# Patient Record
Sex: Male | Born: 1945 | Race: White | Hispanic: No | Marital: Married | Smoking: Former smoker
Health system: Southern US, Community
[De-identification: ages and names within clinical notes are randomized; demographics above are authoritative.]

## PROBLEM LIST (undated history)

## (undated) ENCOUNTER — Ambulatory Visit (HOSPITAL_COMMUNITY): Admission: EM

## (undated) DIAGNOSIS — M1990 Unspecified osteoarthritis, unspecified site: Secondary | ICD-10-CM

## (undated) DIAGNOSIS — B019 Varicella without complication: Secondary | ICD-10-CM

## (undated) DIAGNOSIS — I771 Stricture of artery: Secondary | ICD-10-CM

## (undated) DIAGNOSIS — C61 Malignant neoplasm of prostate: Secondary | ICD-10-CM

## (undated) DIAGNOSIS — T7840XA Allergy, unspecified, initial encounter: Secondary | ICD-10-CM

## (undated) DIAGNOSIS — E785 Hyperlipidemia, unspecified: Secondary | ICD-10-CM

## (undated) DIAGNOSIS — I2510 Atherosclerotic heart disease of native coronary artery without angina pectoris: Secondary | ICD-10-CM

## (undated) DIAGNOSIS — I10 Essential (primary) hypertension: Secondary | ICD-10-CM

## (undated) DIAGNOSIS — N189 Chronic kidney disease, unspecified: Secondary | ICD-10-CM

## (undated) HISTORY — DX: Atherosclerotic heart disease of native coronary artery without angina pectoris: I25.10

## (undated) HISTORY — DX: Varicella without complication: B01.9

## (undated) HISTORY — PX: COLONOSCOPY: SHX174

## (undated) HISTORY — DX: Hyperlipidemia, unspecified: E78.5

## (undated) HISTORY — DX: Stricture of artery: I77.1

## (undated) HISTORY — DX: Essential (primary) hypertension: I10

## (undated) HISTORY — DX: Chronic kidney disease, unspecified: N18.9

## (undated) HISTORY — DX: Allergy, unspecified, initial encounter: T78.40XA

## (undated) HISTORY — DX: Unspecified osteoarthritis, unspecified site: M19.90

---

## 1992-03-23 HISTORY — PX: HERNIA REPAIR: SHX51

## 2012-05-18 LAB — PSA: PSA: 3.82

## 2014-12-01 DIAGNOSIS — K008 Other disorders of tooth development: Secondary | ICD-10-CM | POA: Diagnosis not present

## 2014-12-01 DIAGNOSIS — K047 Periapical abscess without sinus: Secondary | ICD-10-CM | POA: Diagnosis not present

## 2014-12-11 ENCOUNTER — Ambulatory Visit (INDEPENDENT_AMBULATORY_CARE_PROVIDER_SITE_OTHER): Payer: Medicare Other | Admitting: Adult Health

## 2014-12-11 ENCOUNTER — Encounter: Payer: Self-pay | Admitting: Adult Health

## 2014-12-11 VITALS — BP 140/82 | HR 53 | Temp 98.4°F | Ht 72.0 in | Wt 225.8 lb

## 2014-12-11 DIAGNOSIS — Z23 Encounter for immunization: Secondary | ICD-10-CM | POA: Diagnosis not present

## 2014-12-11 DIAGNOSIS — E785 Hyperlipidemia, unspecified: Secondary | ICD-10-CM | POA: Diagnosis not present

## 2014-12-11 DIAGNOSIS — Z7689 Persons encountering health services in other specified circumstances: Secondary | ICD-10-CM

## 2014-12-11 DIAGNOSIS — Z7189 Other specified counseling: Secondary | ICD-10-CM

## 2014-12-11 DIAGNOSIS — I10 Essential (primary) hypertension: Secondary | ICD-10-CM | POA: Diagnosis not present

## 2014-12-11 MED ORDER — INFLUENZA VAC SPLIT HIGH-DOSE 0.5 ML IM SUSY
0.5000 mL | PREFILLED_SYRINGE | Freq: Once | INTRAMUSCULAR | Status: AC
  Administered 2014-12-11: 0.5 mL via INTRAMUSCULAR

## 2014-12-11 MED ORDER — BENAZEPRIL-HYDROCHLOROTHIAZIDE 20-25 MG PO TABS: 1.0000 | ORAL_TABLET | Freq: Every day | ORAL | Status: DC

## 2014-12-11 NOTE — Progress Notes (Addendum)
HPI:  Ordean Fouts is here to establish care.He is a pleasant and healthy 69 year old Caucasian male.  He recently moved to [place] from [place]. He  has a past medical history of Allergy; Hypertension; and Hyperlipidemia.   Last PCP and physical: July 2015  Immunizations:Needs PNA possibly, will get records  Diet:Tries to eat healthy  Exercise: He is doing a lot of yard work. But does not exercise on a regular basis.   Dentist: yes  Is not followed by any other physicians  Has the following chronic problems that require follow up and concerns today:  Hypertension - is controlled on current medication. Monitors at home and has readings of 120's/70.  ROS negative for unless reported above: fevers, chills,feeling poorly, unintentional weight loss, hearing or vision loss, chest pain, palpitations, leg claudication, struggling to breath,Not feeling congested in the chest, no orthopenia, no cough,no wheezing, normal appetite, no soft tissue swelling, no hemoptysis, melena, hematochezia, hematuria, falls, loc, si, or thoughts of self harm.     Past Medical History  Diagnosis Date  . Allergy   . Hypertension   . Hyperlipidemia     Past Surgical History  Procedure Laterality Date  . Hernia repair      Family History  Problem Relation Age of Onset  . Dementia Mother   . Miscarriages / [place] Mother   . Stroke Father     From dental visit    Social History   Social History  . Marital Status: Married    Spouse Name: N/A  . Number of Children: N/A  . Years of Education: N/A   Social History Main Topics  . Smoking status: Former Smoker    Quit date: 03/24/1987  . Smokeless tobacco: Never Used  . Alcohol Use: None     Comment: Socially  . Drug Use: None  . Sexual Activity: Not Asked   Other Topics Concern  . 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    Married   No biological children      Current outpatient prescriptions:  .  aspirin 81 MG tablet, Take 81 mg by mouth daily., Disp: , Rfl:  .  benazepril-hydrochlorthiazide (LOTENSIN HCT) 20-25 MG per tablet, Take 1 tablet by mouth daily., Disp: , Rfl:  .  benazepril-hydrochlorthiazide (LOTENSIN HCT) 5-6.25 MG per tablet, Take 1 tablet by mouth daily., Disp: , Rfl:   EXAM:  Filed Vitals:   12/11/14 0952  BP: 140/82  Pulse: 53  Temp: 98.4 F (36.9 C)    Body mass index is 30.62 kg/(m^2).  GENERAL: vitals reviewed and listed above, alert, oriented, appears well hydrated and in no acute distress  HEENT: atraumatic, conjunttiva clear, no obvious abnormalities on inspection of external nose and ears  NECK: Neck is soft and supple without masses, no adenopathy or thyromegaly, trachea midline, no JVD. Normal range of motion.   LUNGS: clear to auscultation bilaterally, no wheezes, rales or rhonchi, good air movement  CV: Regular rate and rhythm, normal S1/S2, no audible murmurs, gallops, or rubs. No carotid bruit and no peripheral edema.   MS: moves all extremities without noticeable abnormality. No edema noted  Abd: soft/nontender/nondistended/normal bowel sounds   Skin: warm and dry, no rash   Extremities: No clubbing, cyanosis, or edema. Capillary refill is WNL. Pulses intact bilaterally in upper and lower extremities.   Neuro: CN II-XII intact, sensation and reflexes normal throughout, 5/5 muscle  strength in bilateral upper and lower extremities. Normal finger to nose. Normal rapid alternating movements. Normal romberg. No pronator drift.   PSYCH: pleasant and cooperative, no obvious depression or anxiety  ASSESSMENT AND PLAN:  1. Encounter to establish care - Will request information from previous PCP - Follow up at next available for CPE - Follow up as needed  2. Essential hypertension - Continue with current medication - Will keep monitoring at next appointment - Refilled Lotensin 20-25  3.  Hyperlipidemia - has taken statin medications and was unable to tolerate. Will check lipids at physical.  - Consider referral to lipid clinic.   -We reviewed the PMH, PSH, FH, SH, Meds and Allergies. -We provided refills for any medications we will prescribe as needed. -We addressed current concerns per orders and patient instructions. -We have asked for records for pertinent exams, studies, vaccines and notes from previous providers. -We have advised patient to follow up per instructions below.   -Patient advised to return or notify a provider immediately if symptoms worsen or persist or new concerns arise.    Dorothyann Peng, AGNP

## 2014-12-11 NOTE — Addendum Note (Signed)
Addended by: Apolinar Junes on: 12/11/2014 12:10 PM   Modules accepted: Orders

## 2014-12-11 NOTE — Patient Instructions (Addendum)
It was great meeting you today! Welcome to [place].   Follow up with me at my next available physical appointment.   Continue to work on diet and exercise.   If you need anything in the meantime, please let me know.    Health Maintenance A healthy lifestyle and preventative care can promote health and wellness.  Maintain regular health, dental, and eye exams.  Eat a healthy diet. Foods like vegetables, fruits, whole grains, low-fat dairy products, and lean protein foods contain the nutrients you need and are low in calories. Decrease your intake of foods high in solid fats, added sugars, and salt. Get information about a proper diet from your health care provider, if necessary.  Regular physical exercise is one of the most important things you can do for your health. Most adults should get at least 150 minutes of moderate-intensity exercise (any activity that increases your heart rate and causes you to sweat) each week. In addition, most adults need muscle-strengthening exercises on 2 or more days a week.   Maintain a healthy weight. The body mass index (BMI) is a screening tool to identify possible weight problems. It provides an estimate of body fat based on height and weight. Your health care provider can find your BMI and can help you achieve or maintain a healthy weight. For males 20 years and older:  A BMI below 18.5 is considered underweight.  A BMI of 18.5 to 24.9 is normal.  A BMI of 25 to 29.9 is considered overweight.  A BMI of 30 and above is considered obese.  Maintain normal blood lipids and cholesterol by exercising and minimizing your intake of saturated fat. Eat a balanced diet with plenty of fruits and vegetables. Blood tests for lipids and cholesterol should begin at age 77 and be repeated every 5 years. If your lipid or cholesterol levels are high, you are over age 43, or you are at high risk for heart disease, you may need your cholesterol levels checked more  frequently.Ongoing high lipid and cholesterol levels should be treated with medicines if diet and exercise are not working.  If you smoke, find out from your health care provider how to quit. If you do not use tobacco, do not start.  Lung cancer screening is recommended for adults aged 68-80 years who are at high risk for developing lung cancer because of a history of smoking. A yearly low-dose CT scan of the lungs is recommended for people who have at least a 30-pack-year history of smoking and are current smokers or have quit within the past 15 years. A pack year of smoking is smoking an average of 1 pack of cigarettes a day for 1 year (for example, a 30-pack-year history of smoking could mean smoking 1 pack a day for 30 years or 2 packs a day for 15 years). Yearly screening should continue until the smoker has stopped smoking for at least 15 years. Yearly screening should be stopped for people who develop a health problem that would prevent them from having lung cancer treatment.  If you choose to drink alcohol, do not have more than 2 drinks per day. One drink is considered to be 12 oz (360 mL) of beer, 5 oz (150 mL) of wine, or 1.5 oz (45 mL) of liquor.  Avoid the use of street drugs. Do not share needles with anyone. Ask for help if you need support or instructions about stopping the use of drugs.  High blood pressure causes heart  disease and increases the risk of stroke. Blood pressure should be checked at least every 1-2 years. Ongoing high blood pressure should be treated with medicines if weight loss and exercise are not effective.  If you are 87-28 years old, ask your health care provider if you should take aspirin to prevent heart disease.  Diabetes screening involves taking a blood sample to check your fasting blood sugar level. This should be done once every 3 years after age 19 if you are at a normal weight and without risk factors for diabetes. Testing should be considered at a younger  age or be carried out more frequently if you are overweight and have at least 1 risk factor for diabetes.  Colorectal cancer can be detected and often prevented. Most routine colorectal cancer screening begins at the age of 35 and continues through age 9. However, your health care provider may recommend screening at an earlier age if you have risk factors for colon cancer. On a yearly basis, your health care provider may provide home test kits to check for hidden blood in the stool. A small camera at the end of a tube may be used to directly examine the colon (sigmoidoscopy or colonoscopy) to detect the earliest forms of colorectal cancer. Talk to your health care provider about this at age 65 when routine screening begins. A direct exam of the colon should be repeated every 5-10 years through age 10, unless early forms of precancerous polyps or small growths are found.  People who are at an increased risk for hepatitis B should be screened for this virus. You are considered at high risk for hepatitis B if:  You were born in a country where hepatitis B occurs often. Talk with your health care provider about which countries are considered high risk.  Your parents were born in a high-risk country and you have not received a shot to protect against hepatitis B (hepatitis B vaccine).  You have HIV or AIDS.  You use needles to inject street drugs.  You live with, or have sex with, someone who has hepatitis B.  You are a man who has sex with other men (MSM).  You get hemodialysis treatment.  You take certain medicines for conditions like cancer, organ transplantation, and autoimmune conditions.  Hepatitis C blood testing is recommended for all people born from [birth year] through 1963 and any individual with known risk factors for hepatitis C.  Healthy men should no longer receive prostate-specific antigen (PSA) blood tests as part of routine cancer screening. Talk to your health care provider about  prostate cancer screening.  Testicular cancer screening is not recommended for adolescents or adult males who have no symptoms. Screening includes self-exam, a health care provider exam, and other screening tests. Consult with your health care provider about any symptoms you have or any concerns you have about testicular cancer.  Practice safe sex. Use condoms and avoid high-risk sexual practices to reduce the spread of sexually transmitted infections (STIs).  You should be screened for STIs, including gonorrhea and chlamydia if:  You are sexually active and are younger than 24 years.  You are older than 24 years, and your health care provider tells you that you are at risk for this type of infection.  Your sexual activity has changed since you were last screened, and you are at an increased risk for chlamydia or gonorrhea. Ask your health care provider if you are at risk.  If you are at risk of being infected  with HIV, it is recommended that you take a prescription medicine daily to prevent HIV infection. This is called pre-exposure prophylaxis (PrEP). You are considered at risk if:  You are a man who has sex with other men (MSM).  You are a heterosexual man who is sexually active with multiple partners.  You take drugs by injection.  You are sexually active with a partner who has HIV.  Talk with your health care provider about whether you are at high risk of being infected with HIV. If you choose to begin PrEP, you should first be tested for HIV. You should then be tested every 3 months for as long as you are taking PrEP.  Use sunscreen. Apply sunscreen liberally and repeatedly throughout the day. You should seek shade when your shadow is shorter than you. Protect yourself by wearing long sleeves, pants, a wide-brimmed hat, and sunglasses year round whenever you are outdoors.  Tell your health care provider of new moles or changes in moles, especially if there is a change in shape or  color. Also, tell your health care provider if a mole is larger than the size of a pencil eraser.  A one-time screening for abdominal aortic aneurysm (AAA) and surgical repair of large AAAs by ultrasound is recommended for men aged 23-75 years who are current or former smokers.  Stay current with your vaccines (immunizations). Document Released: 09/05/2007 Document Revised: 03/14/2013 Document Reviewed: 08/04/2010 [facility] Patient Information 2015 [place], [place]. This information is not intended to replace advice given to you by your health care provider. Make sure you discuss any questions you have with your health care provider.

## 2014-12-11 NOTE — Progress Notes (Signed)
Pre visit review using our clinic review tool, if applicable. No additional management support is needed unless otherwise documented below in the visit note. 

## 2015-01-02 ENCOUNTER — Encounter: Payer: Self-pay | Admitting: Adult Health

## 2015-01-18 ENCOUNTER — Other Ambulatory Visit (INDEPENDENT_AMBULATORY_CARE_PROVIDER_SITE_OTHER): Payer: Medicare Other

## 2015-01-18 ENCOUNTER — Other Ambulatory Visit: Payer: Medicare Other

## 2015-01-18 DIAGNOSIS — Z0000 Encounter for general adult medical examination without abnormal findings: Secondary | ICD-10-CM | POA: Diagnosis not present

## 2015-01-18 DIAGNOSIS — Z125 Encounter for screening for malignant neoplasm of prostate: Secondary | ICD-10-CM

## 2015-01-18 DIAGNOSIS — E785 Hyperlipidemia, unspecified: Secondary | ICD-10-CM

## 2015-01-18 DIAGNOSIS — I10 Essential (primary) hypertension: Secondary | ICD-10-CM

## 2015-01-18 LAB — HEPATIC FUNCTION PANEL
ALK PHOS: 69 U/L (ref 39–117)
ALT: 18 U/L (ref 0–53)
AST: 13 U/L (ref 0–37)
Albumin: 4 g/dL (ref 3.5–5.2)
BILIRUBIN DIRECT: 0.2 mg/dL (ref 0.0–0.3)
BILIRUBIN TOTAL: 1.1 mg/dL (ref 0.2–1.2)
Total Protein: 6.7 g/dL (ref 6.0–8.3)

## 2015-01-18 LAB — BASIC METABOLIC PANEL
BUN: 20 mg/dL (ref 6–23)
CALCIUM: 9.2 mg/dL (ref 8.4–10.5)
CO2: 30 meq/L (ref 19–32)
CREATININE: 0.85 mg/dL (ref 0.40–1.50)
Chloride: 100 mEq/L (ref 96–112)
GFR: 95.54 mL/min (ref >60.00)
GLUCOSE: 95 mg/dL (ref 70–99)
Potassium: 4.5 mEq/L (ref 3.5–5.1)
Sodium: 138 mEq/L (ref 135–145)

## 2015-01-18 LAB — POCT URINALYSIS DIPSTICK
BILIRUBIN UA: NEGATIVE
Blood, UA: NEGATIVE
Glucose, UA: NEGATIVE
KETONES UA: NEGATIVE
LEUKOCYTES UA: NEGATIVE
Nitrite, UA: NEGATIVE
Protein, UA: NEGATIVE
Spec Grav, UA: 1.025
Urobilinogen, UA: 0.2
pH, UA: 7

## 2015-01-18 LAB — CBC WITH DIFFERENTIAL/PLATELET
BASOS ABS: 0.1 10*3/uL (ref 0.0–0.1)
Basophils Relative: 0.7 % (ref 0.0–3.0)
EOS ABS: 0.4 10*3/uL (ref 0.0–0.7)
Eosinophils Relative: 5.3 % — ABNORMAL HIGH (ref 0.0–5.0)
HCT: 41 % (ref 39.0–52.0)
Hemoglobin: 13.5 g/dL (ref 13.0–17.0)
LYMPHS ABS: 2.3 10*3/uL (ref 0.7–4.0)
Lymphocytes Relative: 33.2 % (ref 12.0–46.0)
MCHC: 33 g/dL (ref 30.0–36.0)
MCV: 91.6 fl (ref 78.0–100.0)
Monocytes Absolute: 0.5 10*3/uL (ref 0.1–1.0)
Monocytes Relative: 6.9 % (ref 3.0–12.0)
NEUTROS ABS: 3.8 10*3/uL (ref 1.4–7.7)
NEUTROS PCT: 53.9 % (ref 43.0–77.0)
PLATELETS: 252 10*3/uL (ref 150.0–400.0)
RBC: 4.47 Mil/uL (ref 4.22–5.81)
RDW: 14.5 % (ref 11.5–15.5)
WBC: 7 10*3/uL (ref 4.0–10.5)

## 2015-01-18 LAB — TSH: TSH: 0.54 u[IU]/mL (ref 0.35–4.50)

## 2015-01-18 LAB — LIPID PANEL
CHOL/HDL RATIO: 4
Cholesterol: 172 mg/dL (ref 0–200)
HDL: 47.8 mg/dL (ref >39.00)
LDL Cholesterol: 112 mg/dL — ABNORMAL HIGH (ref 0–99)
NONHDL: 123.73
Triglycerides: 57 mg/dL (ref 0.0–149.0)
VLDL: 11.4 mg/dL (ref 0.0–40.0)

## 2015-01-18 LAB — PSA: PSA: 5.09 ng/mL — ABNORMAL HIGH (ref 0.10–4.00)

## 2015-01-25 ENCOUNTER — Encounter: Payer: Self-pay | Admitting: Adult Health

## 2015-01-25 ENCOUNTER — Ambulatory Visit (INDEPENDENT_AMBULATORY_CARE_PROVIDER_SITE_OTHER): Payer: Medicare Other | Admitting: Adult Health

## 2015-01-25 VITALS — BP 102/60 | Temp 98.5°F | Ht 71.5 in | Wt 228.4 lb

## 2015-01-25 DIAGNOSIS — R972 Elevated prostate specific antigen [PSA]: Secondary | ICD-10-CM

## 2015-01-25 DIAGNOSIS — Z0000 Encounter for general adult medical examination without abnormal findings: Secondary | ICD-10-CM

## 2015-01-25 DIAGNOSIS — I10 Essential (primary) hypertension: Secondary | ICD-10-CM | POA: Diagnosis not present

## 2015-01-25 MED ORDER — MELOXICAM 15 MG PO TABS
15.0000 mg | ORAL_TABLET | Freq: Every day | ORAL | Status: DC
Start: 2015-01-25 — End: 2015-03-01

## 2015-01-25 NOTE — Progress Notes (Signed)
Pre visit review using our clinic review tool, if applicable. No additional management support is needed unless otherwise documented below in the visit note. 

## 2015-01-25 NOTE — Progress Notes (Addendum)
Subjective:  Patient presents today for their annual wellness visit.    All immunizations and health maintenance protocols were reviewed with the patient and needed orders were placed.  Appropriate screening laboratory values were reviewed with the patient including screening of hyperlipidemia, renal function and hepatic function. If indicated by BPH, a PSA was ordered.  Medication reconciliation,  past medical history, social history, problem list and allergies were reviewed in detail with the patient  Goals were established with regard to weight loss, exercise, and  diet in compliance with medications  End of life planning was discussed.- Has advanced directives and living will.   Preventive Screening-Counseling & Management  Smoking Status: Former smoker, quit in 1989 Second Hand Smoking status: No smokers in home  Risk Factors Regular exercise: Yard work Diet: Healthy eating  Fall Risk: None   Cardiac risk factors:  advanced age (older than 64 for men, 17 for women)  No Hyperlipidemia  No diabetes.  Family History: None  Depression Screen None. PHQ2 0   Activities of Daily Living Independent ADLs and IADLs   Hearing Difficulties: patient declines  Cognitive Testing No reported trouble.   Normal 3 word recall  List the Names of Other Physician/Practitioners you currently use: 1.None  Immunization History  Administered Date(s) Administered  . Influenza, High Dose Seasonal PF 12/11/2014  . Tdap 04/15/2010  . Zoster 05/18/2009   Required Immunizations needed today: None. Unknown if he has had pneumonia vaccination. Need to get records  Screening tests- up to date Health Maintenance Due  Topic Date Due  . Hepatitis C Screening  02-19-1946  . PNA vac Low Risk Adult (1 of 2 - PCV13) 12/30/2012    ROS- No pertinent positives discovered in course of AWV  The following were reviewed and entered/updated in epic: Past Medical History    Diagnosis Date  . Allergy   . Hypertension   . Hyperlipidemia   . DJD (degenerative joint disease)   . Chicken pox   . Chronic kidney disease     From medical records in [place]  . CAD (coronary artery disease)     From medical records in [place]  . 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 Essential hypertension 12/11/2014  . Hyperlipidemia 12/11/2014   Past Surgical History  Procedure Laterality Date  . Hernia repair  1994    Family History  Problem Relation Age of Onset  . Dementia Mother   . Miscarriages / [place] Mother   . Stroke Father     From dental visit  . Prostate cancer Father   . Lung cancer Paternal Uncle     Medications- reviewed and updated Current Outpatient Prescriptions  Medication Sig Dispense Refill  . aspirin 81 MG tablet Take 81 mg by mouth daily.    . benazepril-hydrochlorthiazide (LOTENSIN HCT) 20-25 MG per tablet Take 1 tablet by mouth daily. 90 tablet 3   No current facility-administered medications for this visit.    Allergies-reviewed and updated No Known Allergies  Social History   Social History  . Marital Status: Married    Spouse Name: N/A  . Number of Children: N/A  . Years of Education: N/A   Social History Main Topics  . Smoking status: Former Smoker    Quit date: 03/24/1987  . Smokeless tobacco: Never Used  . Alcohol Use: None     Comment: Socially  . Drug  Use: None  . Sexual Activity: Not Asked   Other Topics Concern  . 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    Married   No biological children    Objective: BP 102/60 mmHg  Temp(Src) 98.5 F (36.9 C) (Oral)  Ht 5' 11.5" (1.816 m)  Wt 228 lb 6.4 oz (103.602 kg)  BMI 31.41 kg/m2   GENERAL: vitals reviewed and listed above, alert, oriented, appears well hydrated and in no acute distress  HEENT: atraumatic,  conjunttiva clear, no obvious abnormalities on inspection of external nose and ears  NECK: Neck is soft and supple without masses, no adenopathy or thyromegaly, trachea midline, no JVD. Normal range of motion.   LUNGS: clear to auscultation bilaterally, no wheezes, rales or rhonchi, good air movement  CV: Regular rate and rhythm, normal S1/S2, no audible murmurs, gallops, or rubs. No carotid bruit and no peripheral edema.   MS: moves all extremities without noticeable abnormality. No edema noted  Abd: soft/nontender/nondistended/normal bowel sounds . Slightly obese  Prostate Exam: Symmetrical, no masses felt. External hemorrhoid noticed   Skin: warm and dry, no rash   Extremities: No clubbing, cyanosis, or edema. Capillary refill is WNL. Pulses intact bilaterally in upper and lower extremities.   Neuro: CN II-XII intact, sensation and reflexes normal throughout, 5/5 muscle strength in bilateral upper and lower extremities. Normal finger to nose. Normal rapid alternating movements.  PSYCH: pleasant and cooperative, no obvious depression or anxiety  Assessment/Plan:  1. Routine general medical examination at a health care facility - Follow up in one year for CPE - Follow up sooner if needed - Use the facilities at the [facility] like the pool to help with exercise and arthritic pain  2. Essential hypertension - Controlled on current medications- no change - EKG 12-Lead- NSR, Rate 61  3. Elevated PSA -  Lab Results  Component Value Date   PSA 5.09* 01/18/2015    - PSA; Future  Return precautions advised.   [place]

## 2015-01-25 NOTE — Patient Instructions (Addendum)
It was great seeing you again!  I have sent in a prescription for Mobic, take one half a pill first to see if that helps. If not, you can take an entire pill  Come back in 3 months for a repeat PSA.   Don't climb ladders greater than 3 feet!   Health Maintenance, Male A healthy lifestyle and preventative care can promote health and wellness.  Maintain regular health, dental, and eye exams.  Eat a healthy diet. Foods like vegetables, fruits, whole grains, low-fat dairy products, and lean protein foods contain the nutrients you need and are low in calories. Decrease your intake of foods high in solid fats, added sugars, and salt. Get information about a proper diet from your health care provider, if necessary.  Regular physical exercise is one of the most important things you can do for your health. Most adults should get at least 150 minutes of moderate-intensity exercise (any activity that increases your heart rate and causes you to sweat) each week. In addition, most adults need muscle-strengthening exercises on 2 or more days a week.   Maintain a healthy weight. The body mass index (BMI) is a screening tool to identify possible weight problems. It provides an estimate of body fat based on height and weight. Your health care provider can find your BMI and can help you achieve or maintain a healthy weight. For males 20 years and older:  A BMI below 18.5 is considered underweight.  A BMI of 18.5 to 24.9 is normal.  A BMI of 25 to 29.9 is considered overweight.  A BMI of 30 and above is considered obese.  Maintain normal blood lipids and cholesterol by exercising and minimizing your intake of saturated fat. Eat a balanced diet with plenty of fruits and vegetables. Blood tests for lipids and cholesterol should begin at age 23 and be repeated every 5 years. If your lipid or cholesterol levels are high, you are over age 33, or you are at high risk for heart disease, you may need your  cholesterol levels checked more frequently.Ongoing high lipid and cholesterol levels should be treated with medicines if diet and exercise are not working.  If you smoke, find out from your health care provider how to quit. If you do not use tobacco, do not start.  Lung cancer screening is recommended for adults aged 49-80 years who are at high risk for developing lung cancer because of a history of smoking. A yearly low-dose CT scan of the lungs is recommended for people who have at least a 30-pack-year history of smoking and are current smokers or have quit within the past 15 years. A pack year of smoking is smoking an average of 1 pack of cigarettes a day for 1 year (for example, a 30-pack-year history of smoking could mean smoking 1 pack a day for 30 years or 2 packs a day for 15 years). Yearly screening should continue until the smoker has stopped smoking for at least 15 years. Yearly screening should be stopped for people who develop a health problem that would prevent them from having lung cancer treatment.  If you choose to drink alcohol, do not have more than 2 drinks per day. One drink is considered to be 12 oz (360 mL) of beer, 5 oz (150 mL) of wine, or 1.5 oz (45 mL) of liquor.  Avoid the use of street drugs. Do not share needles with anyone. Ask for help if you need support or instructions about stopping the  use of drugs.  High blood pressure causes heart disease and increases the risk of stroke. High blood pressure is more likely to develop in:  People who have blood pressure in the end of the normal range (100-139/85-89 mm Hg).  People who are overweight or obese.  People who are African American.  If you are 74-40 years of age, have your blood pressure checked every 3-5 years. If you are 33 years of age or older, have your blood pressure checked every year. You should have your blood pressure measured twice--once when you are at a hospital or clinic, and once when you are not at a  hospital or clinic. Record the average of the two measurements. To check your blood pressure when you are not at a hospital or clinic, you can use:  An automated blood pressure machine at a pharmacy.  A home blood pressure monitor.  If you are 81-83 years old, ask your health care provider if you should take aspirin to prevent heart disease.  Diabetes screening involves taking a blood sample to check your fasting blood sugar level. This should be done once every 3 years after age 33 if you are at a normal weight and without risk factors for diabetes. Testing should be considered at a younger age or be carried out more frequently if you are overweight and have at least 1 risk factor for diabetes.  Colorectal cancer can be detected and often prevented. Most routine colorectal cancer screening begins at the age of 15 and continues through age 63. However, your health care provider may recommend screening at an earlier age if you have risk factors for colon cancer. On a yearly basis, your health care provider may provide home test kits to check for hidden blood in the stool. A small camera at the end of a tube may be used to directly examine the colon (sigmoidoscopy or colonoscopy) to detect the earliest forms of colorectal cancer. Talk to your health care provider about this at age 45 when routine screening begins. A direct exam of the colon should be repeated every 5-10 years through age 68, unless early forms of precancerous polyps or small growths are found.  People who are at an increased risk for hepatitis B should be screened for this virus. You are considered at high risk for hepatitis B if:  You were born in a country where hepatitis B occurs often. Talk with your health care provider about which countries are considered high risk.  Your parents were born in a high-risk country and you have not received a shot to protect against hepatitis B (hepatitis B vaccine).  You have HIV or AIDS.  You  use needles to inject street drugs.  You live with, or have sex with, someone who has hepatitis B.  You are a man who has sex with other men (MSM).  You get hemodialysis treatment.  You take certain medicines for conditions like cancer, organ transplantation, and autoimmune conditions.  Hepatitis C blood testing is recommended for all people born from [birth year] through 1963 and any individual with known risk factors for hepatitis C.  Healthy men should no longer receive prostate-specific antigen (PSA) blood tests as part of routine cancer screening. Talk to your health care provider about prostate cancer screening.  Testicular cancer screening is not recommended for adolescents or adult males who have no symptoms. Screening includes self-exam, a health care provider exam, and other screening tests. Consult with your health care provider about any symptoms  you have or any concerns you have about testicular cancer.  Practice safe sex. Use condoms and avoid high-risk sexual practices to reduce the spread of sexually transmitted infections (STIs).  You should be screened for STIs, including gonorrhea and chlamydia if:  You are sexually active and are younger than 24 years.  You are older than 24 years, and your health care provider tells you that you are at risk for this type of infection.  Your sexual activity has changed since you were last screened, and you are at an increased risk for chlamydia or gonorrhea. Ask your health care provider if you are at risk.  If you are at risk of being infected with HIV, it is recommended that you take a prescription medicine daily to prevent HIV infection. This is called pre-exposure prophylaxis (PrEP). You are considered at risk if:  You are a man who has sex with other men (MSM).  You are a heterosexual man who is sexually active with multiple partners.  You take drugs by injection.  You are sexually active with a partner who has HIV.  Talk with  your health care provider about whether you are at high risk of being infected with HIV. If you choose to begin PrEP, you should first be tested for HIV. You should then be tested every 3 months for as long as you are taking PrEP.  Use sunscreen. Apply sunscreen liberally and repeatedly throughout the day. You should seek shade when your shadow is shorter than you. Protect yourself by wearing long sleeves, pants, a wide-brimmed hat, and sunglasses year round whenever you are outdoors.  Tell your health care provider of new moles or changes in moles, especially if there is a change in shape or color. Also, tell your health care provider if a mole is larger than the size of a pencil eraser.  A one-time screening for abdominal aortic aneurysm (AAA) and surgical repair of large AAAs by ultrasound is recommended for men aged 17-75 years who are current or former smokers.  Stay current with your vaccines (immunizations).   This information is not intended to replace advice given to you by your health care provider. Make sure you discuss any questions you have with your health care provider.   Document Released: 09/05/2007 Document Revised: 03/30/2014 Document Reviewed: 08/04/2010 Elsevier Interactive Patient Education [employer].

## 2015-02-06 ENCOUNTER — Encounter: Payer: Self-pay | Admitting: Adult Health

## 2015-03-01 ENCOUNTER — Other Ambulatory Visit: Payer: Self-pay | Admitting: Adult Health

## 2015-03-01 NOTE — Telephone Encounter (Signed)
Ok to refill for 3 months.  

## 2015-04-29 ENCOUNTER — Encounter: Payer: Self-pay | Admitting: Adult Health

## 2015-04-29 ENCOUNTER — Ambulatory Visit (INDEPENDENT_AMBULATORY_CARE_PROVIDER_SITE_OTHER): Payer: Medicare Other | Admitting: Adult Health

## 2015-04-29 VITALS — BP 110/70 | Temp 97.6°F | Ht 71.5 in | Wt 232.0 lb

## 2015-04-29 DIAGNOSIS — E669 Obesity, unspecified: Secondary | ICD-10-CM

## 2015-04-29 DIAGNOSIS — M25551 Pain in right hip: Secondary | ICD-10-CM

## 2015-04-29 MED ORDER — MELOXICAM 15 MG PO TABS: ORAL_TABLET | ORAL | Status: DC

## 2015-04-29 NOTE — Patient Instructions (Signed)
It was great seeing you again!  Please go to the [facility] office for your x ray. I will follow up with you regarding the results.   Try taking the Mobic in the afternoon or evening.   Add high fiber foods to your diet and drink plenty of fluid.    Follow up with me in one month.

## 2015-04-29 NOTE — Progress Notes (Signed)
Pre visit review using our clinic review tool, if applicable. No additional management support is needed unless otherwise documented below in the visit note. 

## 2015-04-29 NOTE — Progress Notes (Signed)
Subjective:    Patient ID: Jon Summers, male    DOB: January 17, 1946, 70 y.o.   MRN: [id number]  HPI  70 year old male who presents to the office today for follow up on arthritic pain after starting Mobic. He reports that he is no longer waking up with pain and feels as though his quality of life has improved. He is taking 15mg  Mobic every day.   His only complaint is that of pain in his right hip that is waking him up at night. He has had pain in his right hip for the last 3-4 years but the pain has never woken him up at night.   He would also like to discuss medication for weight loss.   Wt Readings from Last 3 Encounters:  04/29/15 232 lb (105.235 kg)  01/25/15 228 lb 6.4 oz (103.602 kg)  12/11/14 225 lb 12.8 oz (102.422 kg)    Review of Systems  Constitutional: Negative.   Respiratory: Negative.   Cardiovascular: Negative.   Gastrointestinal: Negative.   Musculoskeletal: Positive for myalgias and arthralgias. Negative for joint swelling, gait problem, neck pain and neck stiffness.  Skin: Negative.   All other systems reviewed and are negative.  Past Medical History  Diagnosis Date  . Allergy   . Hypertension   . Hyperlipidemia   . DJD (degenerative joint disease)   . Chicken pox   . Chronic kidney disease     From medical records in [place]  . CAD (coronary artery disease)     From medical records in [place]  . 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 Marital Status: Married    Spouse Name: N/A  . Number of Children: N/A  . Years of Education: N/A   Occupational History  . Not on file.   Social History Main Topics  . Smoking status: Former Smoker    Quit date: 03/24/1987  . Smokeless tobacco: Never Used  . Alcohol Use: Not on file     Comment: Socially  . Drug Use: Not on file  . Sexual Activity: Not on file   Other Topics Concern  . 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    Married   No biological children    Past Surgical History  Procedure Laterality Date  . Hernia repair  1994    Family History  Problem Relation Age of Onset  . Dementia Mother   . Miscarriages / [place] Mother   . Stroke Father     From dental visit  . Prostate cancer Father   . Lung cancer Paternal Uncle     No Known Allergies  Current Outpatient Prescriptions on File Prior to Visit  Medication Sig Dispense Refill  . aspirin 81 MG tablet Take 81 mg by mouth daily.    . benazepril-hydrochlorthiazide (LOTENSIN HCT) 20-25 MG per tablet Take 1 tablet by mouth daily. 90 tablet 3  . meloxicam (MOBIC) 15 MG tablet TAKE 1 TABLET (15 MG TOTAL) BY MOUTH DAILY. 30 tablet 3   No current facility-administered medications on file prior to visit.    BP 110/70 mmHg  Temp(Src) 97.6 F (36.4 C) (Oral)  Ht 5' 11.5" (1.816 m)  Wt 232 lb (105.235 kg)  BMI 31.91 kg/m2       Objective:   Physical Exam  Constitutional: He is oriented to person, place, and time. He appears well-developed and well-nourished. No distress.  Musculoskeletal: Normal range of motion. He exhibits no edema or tenderness.  No decreased ROM in right hip No pain with palpation  Neurological: He is alert and oriented to person, place, and time.  Skin: Skin is warm and dry. No rash noted. He is not diaphoretic. No erythema. No pallor.  Psychiatric: He has a normal mood and affect. His behavior is normal. Judgment and thought content normal.  Nursing note and vitals reviewed.      Assessment & Plan:  1. Hip pain, right - DG HIP UNILAT WITH PELVIS 2-3 VIEWS RIGHT; Future - meloxicam (MOBIC) 15 MG tablet; Take one half to one pill as needed for pain  Dispense: 90 tablet; Refill: 1 = We spoke about the dangers of taking NSAIDS long term. He is going to take 1/2 pill as needed for pain - Continue to exercise - Consider referral to ortho  2. Obesity -  Before starting medication for weight loss. I would like him to try increasing the amount of exercise he is doing. I also want him to increase his fiber and water intake.  - Consider Belviq

## 2015-05-03 ENCOUNTER — Ambulatory Visit (INDEPENDENT_AMBULATORY_CARE_PROVIDER_SITE_OTHER)
Admission: RE | Admit: 2015-05-03 | Discharge: 2015-05-03 | Disposition: A | Discharge status: Home / Self Care | Payer: Medicare Other | Source: Ambulatory Visit | Attending: Adult Health | Admitting: Adult Health

## 2015-05-03 DIAGNOSIS — M25551 Pain in right hip: Secondary | ICD-10-CM

## 2015-05-03 DIAGNOSIS — M1611 Unilateral primary osteoarthritis, right hip: Secondary | ICD-10-CM | POA: Diagnosis not present

## 2015-06-06 DIAGNOSIS — R6889 Other general symptoms and signs: Secondary | ICD-10-CM | POA: Diagnosis not present

## 2016-01-01 ENCOUNTER — Telehealth: Payer: Self-pay | Admitting: Adult Health

## 2016-01-01 ENCOUNTER — Other Ambulatory Visit: Payer: Self-pay

## 2016-01-01 MED ORDER — BENAZEPRIL-HYDROCHLOROTHIAZIDE 20-25 MG PO TABS: 1.0000 | ORAL_TABLET | Freq: Every day | ORAL | 1 refills | Status: DC

## 2016-01-01 NOTE — Telephone Encounter (Signed)
Pt need new Rx for benazepril-hydrochlorthiazide  #90 Pharm:  Kristopher Oppenheim at [facility]

## 2016-01-01 NOTE — Telephone Encounter (Signed)
Ok to refill 

## 2016-01-01 NOTE — Telephone Encounter (Signed)
Rx has been refilled.  

## 2016-01-01 NOTE — Telephone Encounter (Signed)
Ok to refill 90 +1  

## 2016-01-16 DIAGNOSIS — H5212 Myopia, left eye: Secondary | ICD-10-CM | POA: Diagnosis not present

## 2016-01-16 DIAGNOSIS — H52223 Regular astigmatism, bilateral: Secondary | ICD-10-CM | POA: Diagnosis not present

## 2016-01-16 DIAGNOSIS — H5201 Hypermetropia, right eye: Secondary | ICD-10-CM | POA: Diagnosis not present

## 2016-01-16 DIAGNOSIS — H524 Presbyopia: Secondary | ICD-10-CM | POA: Diagnosis not present

## 2016-04-17 ENCOUNTER — Other Ambulatory Visit (INDEPENDENT_AMBULATORY_CARE_PROVIDER_SITE_OTHER): Payer: Medicare Other

## 2016-04-17 DIAGNOSIS — Z0000 Encounter for general adult medical examination without abnormal findings: Secondary | ICD-10-CM | POA: Diagnosis not present

## 2016-04-17 LAB — CBC WITH DIFFERENTIAL/PLATELET
BASOS PCT: 0.4 % (ref 0.0–3.0)
Basophils Absolute: 0 10*3/uL (ref 0.0–0.1)
EOS ABS: 0.1 10*3/uL (ref 0.0–0.7)
Eosinophils Relative: 1.4 % (ref 0.0–5.0)
HCT: 37.1 % — ABNORMAL LOW (ref 39.0–52.0)
Hemoglobin: 12.6 g/dL — ABNORMAL LOW (ref 13.0–17.0)
LYMPHS ABS: 2 10*3/uL (ref 0.7–4.0)
Lymphocytes Relative: 33.7 % (ref 12.0–46.0)
MCHC: 33.8 g/dL (ref 30.0–36.0)
MCV: 88.3 fl (ref 78.0–100.0)
MONO ABS: 0.4 10*3/uL (ref 0.1–1.0)
Monocytes Relative: 6.4 % (ref 3.0–12.0)
NEUTROS ABS: 3.5 10*3/uL (ref 1.4–7.7)
NEUTROS PCT: 58.1 % (ref 43.0–77.0)
PLATELETS: 226 10*3/uL (ref 150.0–400.0)
RBC: 4.2 Mil/uL — ABNORMAL LOW (ref 4.22–5.81)
RDW: 14.3 % (ref 11.5–15.5)
WBC: 6.1 10*3/uL (ref 4.0–10.5)

## 2016-04-17 LAB — POC URINALSYSI DIPSTICK (AUTOMATED)
Bilirubin, UA: NEGATIVE
Glucose, UA: NEGATIVE
KETONES UA: NEGATIVE
LEUKOCYTES UA: NEGATIVE
NITRITE UA: NEGATIVE
PH UA: 5.5
PROTEIN UA: NEGATIVE
RBC UA: NEGATIVE
Spec Grav, UA: >=1.03
Urobilinogen, UA: 0.2

## 2016-04-17 LAB — BASIC METABOLIC PANEL
BUN: 18 mg/dL (ref 6–23)
CHLORIDE: 103 meq/L (ref 96–112)
CO2: 31 meq/L (ref 19–32)
CREATININE: 0.87 mg/dL (ref 0.40–1.50)
Calcium: 8.4 mg/dL (ref 8.4–10.5)
GFR: 92.67 mL/min (ref >60.00)
Glucose, Bld: 91 mg/dL (ref 70–99)
Potassium: 4 mEq/L (ref 3.5–5.1)
Sodium: 138 mEq/L (ref 135–145)

## 2016-04-17 LAB — TSH: TSH: 0.6 u[IU]/mL (ref 0.35–4.50)

## 2016-04-17 LAB — LIPID PANEL
Cholesterol: 135 mg/dL (ref 0–200)
HDL: 36.7 mg/dL — AB (ref >39.00)
LDL Cholesterol: 90 mg/dL (ref 0–99)
NONHDL: 98
TRIGLYCERIDES: 41 mg/dL (ref 0.0–149.0)
Total CHOL/HDL Ratio: 4
VLDL: 8.2 mg/dL (ref 0.0–40.0)

## 2016-04-17 LAB — HEPATIC FUNCTION PANEL
ALT: 13 U/L (ref 0–53)
AST: 10 U/L (ref 0–37)
Albumin: 3.8 g/dL (ref 3.5–5.2)
Alkaline Phosphatase: 63 U/L (ref 39–117)
BILIRUBIN DIRECT: 0.2 mg/dL (ref 0.0–0.3)
BILIRUBIN TOTAL: 0.9 mg/dL (ref 0.2–1.2)
Total Protein: 6.2 g/dL (ref 6.0–8.3)

## 2016-04-17 LAB — PSA: PSA: 6.03 ng/mL — AB (ref 0.10–4.00)

## 2016-04-23 ENCOUNTER — Ambulatory Visit (INDEPENDENT_AMBULATORY_CARE_PROVIDER_SITE_OTHER): Payer: Medicare Other | Admitting: Adult Health

## 2016-04-23 ENCOUNTER — Encounter: Payer: Self-pay | Admitting: Adult Health

## 2016-04-23 VITALS — BP 110/64 | Temp 97.7°F | Ht 71.5 in | Wt 222.4 lb

## 2016-04-23 DIAGNOSIS — E785 Hyperlipidemia, unspecified: Secondary | ICD-10-CM | POA: Diagnosis not present

## 2016-04-23 DIAGNOSIS — I10 Essential (primary) hypertension: Secondary | ICD-10-CM

## 2016-04-23 DIAGNOSIS — Z0000 Encounter for general adult medical examination without abnormal findings: Secondary | ICD-10-CM

## 2016-04-23 DIAGNOSIS — R972 Elevated prostate specific antigen [PSA]: Secondary | ICD-10-CM | POA: Diagnosis not present

## 2016-04-23 MED ORDER — BENAZEPRIL-HYDROCHLOROTHIAZIDE 20-25 MG PO TABS: 1.0000 | ORAL_TABLET | Freq: Every day | ORAL | 3 refills | Status: DC

## 2016-04-23 NOTE — Progress Notes (Signed)
Subjective:    Patient ID: Jon Summers, male    DOB: [birth year], 71 y.o.   MRN: [id number]  HPI  Patient presents for yearly preventative medicine examination. He is a pleasant 71 year old male who  has a past medical history of Allergy; CAD (coronary artery disease); Chicken pox; Chronic kidney disease; DJD (degenerative joint disease); Hyperlipidemia; Hypertension; and Tortuous aorta ([place]).  All immunizations and health maintenance protocols were reviewed with the patient and needed orders were placed. He is due for his first pneumonia vaccination   Medication reconciliation,  past medical history, social history, problem list and allergies were reviewed in detail with the patient  Goals were established with regard to weight loss, exercise, and  diet in compliance with medications. He is eating healthy, is doing a lot of walking.   End of life planning was discussed. He has an advanced directive and living will    His blood pressure is well controlled with Lotensin 20-25  He denies any complaints.    Review of Systems  Constitutional: Negative.   HENT: Negative.   Eyes: Negative.   Respiratory: Negative.   Cardiovascular: Negative.   Gastrointestinal: Negative.   Endocrine: Negative.   Genitourinary: Negative.   Musculoskeletal: Negative.   Skin: Negative.   Allergic/Immunologic: Negative.   Neurological: Negative.   Hematological: Negative.   Psychiatric/Behavioral: Negative.   All other systems reviewed and are negative.  Past Medical History:  Diagnosis Date  . Allergy   . CAD (coronary artery disease)    From medical records in [place]  . Chicken pox   . Chronic kidney disease    From medical records in [place]  . DJD (degenerative joint disease)   . Hyperlipidemia   . Hypertension   . Tortuous aorta (HCC)    From medical records in [place] History  . Marital status: Married    Spouse name: N/A  . Number of children: N/A  .  Years of education: N/A   Occupational History  . Not on file.   Social History Main Topics  . Smoking status: Former Smoker    Quit date: 03/24/1987  . Smokeless tobacco: Never Used  . Alcohol use Not on file     Comment: Socially  . Drug use: Unknown  . Sexual activity: Not on file   Other Topics Concern  . 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    Married   No biological children    Past Surgical History:  Procedure Laterality Date  . HERNIA REPAIR  1994    Family History  Problem Relation Age of Onset  . Dementia Mother   . Miscarriages / [place] Mother   . Stroke Father     From dental visit  . Prostate cancer Father   . Lung cancer Paternal Uncle     No Known Allergies  Current Outpatient Prescriptions on File Prior to Visit  Medication Sig Dispense Refill  . aspirin 81 MG tablet Take 81 mg by mouth daily.     No current facility-administered medications on file prior to visit.     BP 110/64   Temp 97.7 F (36.5 C) (Oral)   Ht 5' 11.5" (1.816 m)   Wt 222 lb 6.4 oz (100.9 kg)   BMI 30.59 kg/m       Objective:   Physical Exam  Constitutional: He is oriented to person, place, and time. He appears well-developed and well-nourished. No distress.  HENT:  Head: Normocephalic and atraumatic.  Right Ear: External ear normal.  Left Ear: External ear normal.  Nose: Nose normal.  Mouth/Throat: Oropharynx is clear and moist. No oropharyngeal exudate.  Eyes: Conjunctivae and EOM are normal. Pupils are equal, round, and reactive to light. Right eye exhibits no discharge. Left eye exhibits no discharge. No scleral icterus.  Neck: Normal range of motion. Neck supple. No JVD present. Carotid bruit is not present. No tracheal deviation present. No thyromegaly present.  Cardiovascular: Normal rate, regular rhythm, normal heart sounds and intact distal pulses.  Exam reveals no gallop  and no friction rub.   No murmur heard. Pulmonary/Chest: Effort normal and breath sounds normal. No stridor. No respiratory distress. He has no wheezes. He has no rales. He exhibits no tenderness.  Abdominal: Soft. Bowel sounds are normal. He exhibits no distension and no mass. There is no tenderness. There is no rebound and no guarding.  Genitourinary: Rectal exam shows external hemorrhoid. Rectal exam shows guaiac negative stool. Prostate is enlarged. Prostate is not tender.  Musculoskeletal: Normal range of motion. He exhibits no edema, tenderness or deformity.  Lymphadenopathy:    He has no cervical adenopathy.  Neurological: He is alert and oriented to person, place, and time. He has normal reflexes. He displays normal reflexes. No cranial nerve deficit. He exhibits normal muscle tone. Coordination normal.  Skin: Skin is warm and dry. No rash noted. He is not diaphoretic. No erythema. No pallor.  Psychiatric: He has a normal mood and affect. His behavior is normal. Judgment and thought content normal.  Nursing note and vitals reviewed.     Assessment & Plan:  1. Routine general medical examination at a health care facility - Reviewed labs in detail with patient. All questions answered. His PSA has been increasing. I am going to send him to Urology  - Continue to exercise and eat healthy  - Follow up in one year or sooner if needed  2. Elevated PSA Lab Results  Component Value Date   PSA 6.03 (H) 04/17/2016   PSA 5.09 (H) 01/18/2015   PSA 3.82 05/18/2012    - Ambulatory referral to Urology  3. Essential hypertension - Well controlled.  - benazepril-hydrochlorthiazide (LOTENSIN HCT) 20-25 MG tablet; Take 1 tablet by mouth daily.  Dispense: 90 tablet; Refill: 3  4. Hyperlipidemia, unspecified hyperlipidemia type - Controlled with diet and exercise  Dorothyann Peng, NP

## 2016-04-24 ENCOUNTER — Other Ambulatory Visit: Payer: Medicare Other

## 2016-05-01 ENCOUNTER — Encounter: Payer: Medicare Other | Admitting: Adult Health

## 2016-06-04 ENCOUNTER — Telehealth: Payer: Self-pay | Admitting: Adult Health

## 2016-06-04 DIAGNOSIS — R972 Elevated prostate specific antigen [PSA]: Secondary | ICD-10-CM | POA: Diagnosis not present

## 2016-06-04 NOTE — Telephone Encounter (Signed)
Error/ltd ° °

## 2016-06-23 DIAGNOSIS — C61 Malignant neoplasm of prostate: Secondary | ICD-10-CM | POA: Diagnosis not present

## 2016-06-23 DIAGNOSIS — R972 Elevated prostate specific antigen [PSA]: Secondary | ICD-10-CM | POA: Diagnosis not present

## 2016-06-23 DIAGNOSIS — D075 Carcinoma in situ of prostate: Secondary | ICD-10-CM | POA: Diagnosis not present

## 2016-07-04 IMAGING — DX DG HIP (WITH OR WITHOUT PELVIS) 2-3V*R*
2 series · 2 of 2 positions shown · non-contrast
Comparison: None.

CLINICAL DATA: Generalized right hip pain for 1 year, no known
injury, initial encounter

EXAM:
DG HIP (WITH OR WITHOUT PELVIS) 2-3V RIGHT

[hip ap]
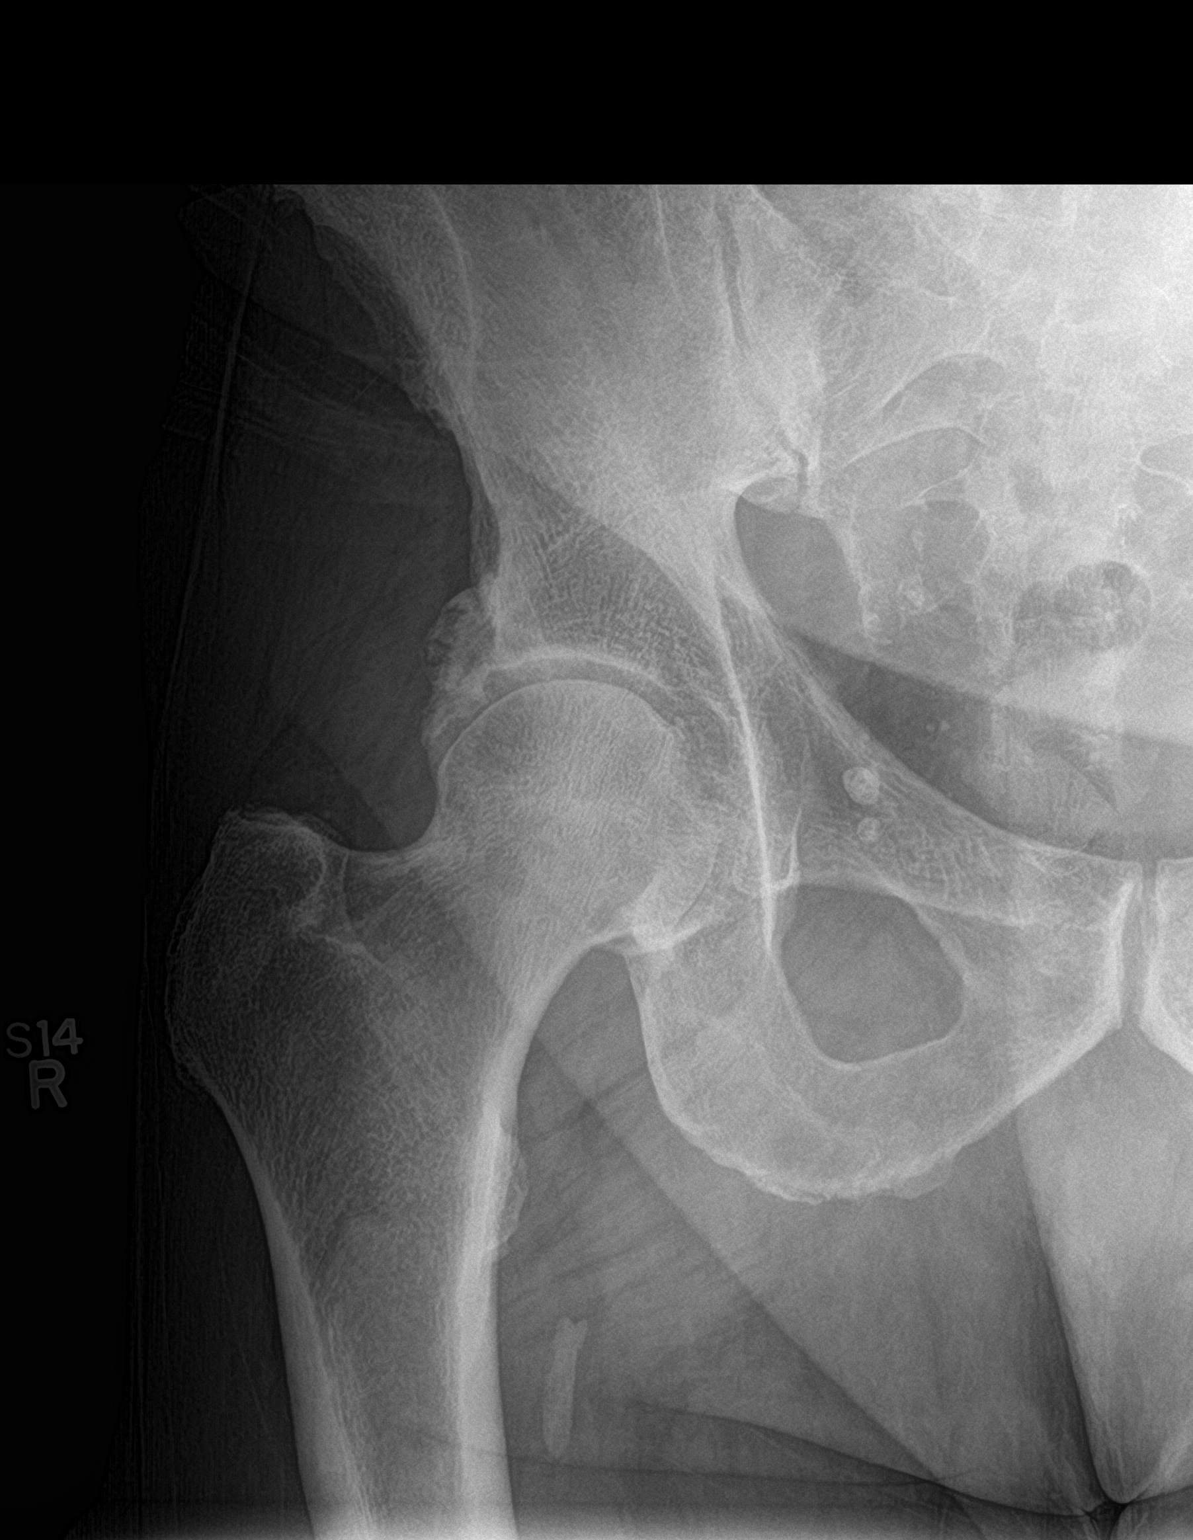

[hip lat]
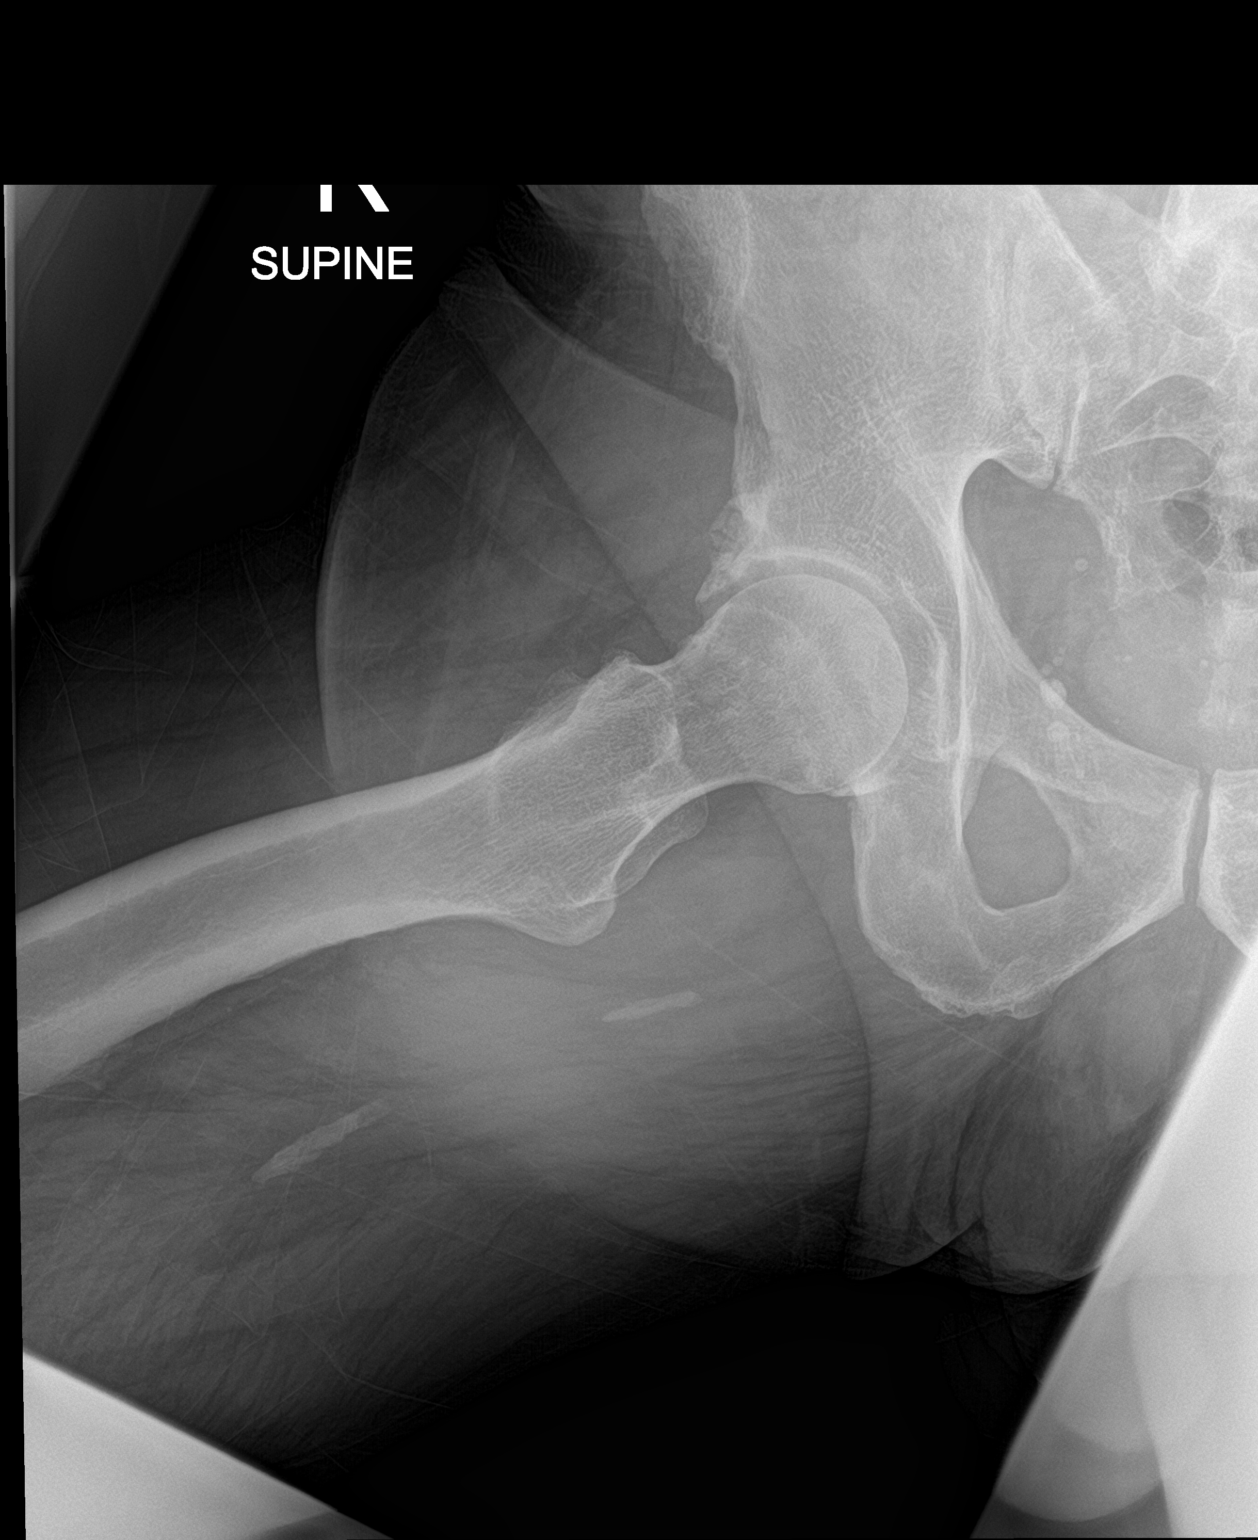

[2 of 2 positions shown; findings below may reference images not displayed]

FINDINGS: Degenerative changes are noted in the right hip joint with superior
spurring from the acetabulum. No fracture or dislocation is noted.
No soft tissue abnormality is seen.
IMPRESSION: Degenerative changes without acute abnormality.

## 2016-07-14 DIAGNOSIS — C61 Malignant neoplasm of prostate: Secondary | ICD-10-CM | POA: Diagnosis not present

## 2016-07-15 ENCOUNTER — Encounter: Payer: Self-pay | Admitting: Radiation Oncology

## 2016-07-31 ENCOUNTER — Encounter: Payer: Self-pay | Admitting: Radiation Oncology

## 2016-07-31 NOTE — Progress Notes (Signed)
GU Location of Tumor / Histology: prostatic adenocarcinoma  If Prostate Cancer, Gleason Score is (3 + 4) and PSA is (6.71). Prostate volume: 45 cc.   Jon Summers was referred by Beaulah Dinning, NP to Dr. Louis Meckel for evaluation and management of an elevated PSA.  Biopsies of prostate (if applicable) revealed:    Past/Anticipated interventions by urology, if any: prostate biopsy, discussion of treatment options, referral to radiation oncology  Past/Anticipated interventions by medical oncology, if any: None  Weight changes, if any: denies  Bowel/Bladder complaints, if any: IPSS 4. Denies dysuria, hematuria, leakage or incontinence.   Nausea/Vomiting, if any: denies  Pain issues, if any:  Arthritic related pain.  SAFETY ISSUES:  Prior radiation? denies  Pacemaker/ICD? denies  Possible current pregnancy? no  Is the patient on methotrexate? denies  Current Complaints / other details:  71 year old male. Married. Father had prostate state.

## 2016-08-03 ENCOUNTER — Ambulatory Visit
Admission: RE | Admit: 2016-08-03 | Discharge: 2016-08-03 | Disposition: A | Discharge status: Home / Self Care | Payer: Medicare Other | Source: Ambulatory Visit | Attending: Radiation Oncology | Admitting: Radiation Oncology

## 2016-08-03 ENCOUNTER — Ambulatory Visit: Admission: RE | Admit: 2016-08-03 | Payer: Medicare Other | Source: Ambulatory Visit

## 2016-08-03 DIAGNOSIS — Z8042 Family history of malignant neoplasm of prostate: Secondary | ICD-10-CM | POA: Insufficient documentation

## 2016-08-03 DIAGNOSIS — E785 Hyperlipidemia, unspecified: Secondary | ICD-10-CM | POA: Insufficient documentation

## 2016-08-03 DIAGNOSIS — Z823 Family history of stroke: Secondary | ICD-10-CM | POA: Insufficient documentation

## 2016-08-03 DIAGNOSIS — Z510 Encounter for antineoplastic radiation therapy: Secondary | ICD-10-CM | POA: Insufficient documentation

## 2016-08-03 DIAGNOSIS — Z7982 Long term (current) use of aspirin: Secondary | ICD-10-CM | POA: Insufficient documentation

## 2016-08-03 DIAGNOSIS — M1990 Unspecified osteoarthritis, unspecified site: Secondary | ICD-10-CM | POA: Insufficient documentation

## 2016-08-03 DIAGNOSIS — N189 Chronic kidney disease, unspecified: Secondary | ICD-10-CM | POA: Insufficient documentation

## 2016-08-03 DIAGNOSIS — C61 Malignant neoplasm of prostate: Secondary | ICD-10-CM | POA: Insufficient documentation

## 2016-08-03 DIAGNOSIS — Z87891 Personal history of nicotine dependence: Secondary | ICD-10-CM | POA: Insufficient documentation

## 2016-08-03 DIAGNOSIS — I129 Hypertensive chronic kidney disease with stage 1 through stage 4 chronic kidney disease, or unspecified chronic kidney disease: Secondary | ICD-10-CM | POA: Insufficient documentation

## 2016-08-03 DIAGNOSIS — I2510 Atherosclerotic heart disease of native coronary artery without angina pectoris: Secondary | ICD-10-CM | POA: Insufficient documentation

## 2016-08-03 DIAGNOSIS — Z79899 Other long term (current) drug therapy: Secondary | ICD-10-CM | POA: Insufficient documentation

## 2016-08-03 HISTORY — DX: Malignant neoplasm of prostate: C61

## 2016-08-05 ENCOUNTER — Ambulatory Visit
Admission: RE | Admit: 2016-08-05 | Discharge: 2016-08-05 | Disposition: A | Discharge status: Home / Self Care | Payer: Medicare Other | Source: Ambulatory Visit | Attending: Radiation Oncology | Admitting: Radiation Oncology

## 2016-08-05 ENCOUNTER — Inpatient Hospital Stay: Admission: RE | Admit: 2016-08-05 | Payer: Medicare Other | Source: Ambulatory Visit

## 2016-08-05 ENCOUNTER — Encounter: Payer: Self-pay | Admitting: Radiation Oncology

## 2016-08-05 VITALS — BP 129/76 | HR 46 | Temp 98.0°F | Resp 16 | Ht 71.0 in | Wt 210.6 lb

## 2016-08-05 DIAGNOSIS — Z79899 Other long term (current) drug therapy: Secondary | ICD-10-CM | POA: Diagnosis not present

## 2016-08-05 DIAGNOSIS — N189 Chronic kidney disease, unspecified: Secondary | ICD-10-CM | POA: Diagnosis not present

## 2016-08-05 DIAGNOSIS — I2510 Atherosclerotic heart disease of native coronary artery without angina pectoris: Secondary | ICD-10-CM | POA: Diagnosis not present

## 2016-08-05 DIAGNOSIS — E785 Hyperlipidemia, unspecified: Secondary | ICD-10-CM | POA: Diagnosis not present

## 2016-08-05 DIAGNOSIS — C61 Malignant neoplasm of prostate: Secondary | ICD-10-CM

## 2016-08-05 DIAGNOSIS — Z823 Family history of stroke: Secondary | ICD-10-CM | POA: Diagnosis not present

## 2016-08-05 DIAGNOSIS — R972 Elevated prostate specific antigen [PSA]: Secondary | ICD-10-CM | POA: Diagnosis not present

## 2016-08-05 DIAGNOSIS — M1990 Unspecified osteoarthritis, unspecified site: Secondary | ICD-10-CM | POA: Diagnosis not present

## 2016-08-05 DIAGNOSIS — Z7982 Long term (current) use of aspirin: Secondary | ICD-10-CM | POA: Diagnosis not present

## 2016-08-05 DIAGNOSIS — Z510 Encounter for antineoplastic radiation therapy: Secondary | ICD-10-CM | POA: Diagnosis not present

## 2016-08-05 DIAGNOSIS — Z8042 Family history of malignant neoplasm of prostate: Secondary | ICD-10-CM | POA: Diagnosis not present

## 2016-08-05 DIAGNOSIS — I129 Hypertensive chronic kidney disease with stage 1 through stage 4 chronic kidney disease, or unspecified chronic kidney disease: Secondary | ICD-10-CM | POA: Diagnosis not present

## 2016-08-05 DIAGNOSIS — Z87891 Personal history of nicotine dependence: Secondary | ICD-10-CM | POA: Diagnosis not present

## 2016-08-05 NOTE — Progress Notes (Signed)
Radiation Oncology         [phone]) [phone] ________________________________  Initial Outpatient Consultation  Name: Jon Summers MRN: [id number]  Date: 08/05/2016  DOB: [birth year]  EY:CXKGYJEH, Tommi Rumps, NP  Ardis Hughs, MD   REFERRING PHYSICIAN: Ardis Hughs, MD  DIAGNOSIS: The encounter diagnosis was Malignant neoplasm of prostate [facility]).    ICD-9-CM ICD-10-CM   1. Malignant neoplasm of prostate (HCC) 185 C61     HISTORY OF PRESENT ILLNESS: HISTORY OF PRESENT ILLNESS: Jon Summers is a 71 y.o. man seen at the request of Dr. Louis Meckel for a new diagnosis of prostate cancer. He was noted to have an elevated PSA of 6.03 by his primary care physician, Beaulah Dinning, NP, and accordingly, he was referred for evaluation in urology by Dr. Louis Meckel.  On 06/04/16,  digital rectal examination was performed at that time revealing normal consistency bilaterally without nodularity. A repeat PSA was obtained on 06/04/16 and returned further elevated at 6.71.  The patient proceeded to transrectal ultrasound with 12 biopsies of the prostate on 06/23/16.  The prostate volume measured 45 cc.  Out of 12 core biopsies, 7 were positive.  The maximum Gleason score was 3+4, and this was seen in the right lateral base.  There is a family history of prostate cancer in his father, diagnosed in his early 79s and treated with EBRT.  PSA Trend: 05/2016: 6.71 04/2016: 6.03 12/2014: 5.09 02/2013: 3.82  The patient reviewed the biopsy results with his urologist and he has kindly been referred today for discussion of potential radiation treatment options.  PREVIOUS RADIATION THERAPY: No  PAST MEDICAL HISTORY:  Past Medical History:  Diagnosis Date  . Allergy   . CAD (coronary artery disease)    From medical records in [place]  . Chicken pox   . Chronic kidney disease    From medical records in [place]  . DJD (degenerative joint disease)   . Hyperlipidemia   . Hypertension   . Prostate cancer ([place])   .  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 HERNIA REPAIR  1994    FAMILY HISTORY:  Family History  Problem Relation Age of Onset  . Dementia Mother   . Miscarriages / [place] Mother   . Stroke Father        From dental visit  . Prostate cancer Father   . Cancer Father 35       prostate, treated with radiation  . Lung cancer Paternal Uncle   . Cancer Paternal Uncle        unknown/heavy smoker    SOCIAL HISTORY:  Social History   Social History  . Marital status: Married    Spouse name: N/A  . Number of children: N/A  . Years of education: N/A   Occupational History  . Not on file.   Social History Main Topics  . Smoking status: Former Smoker    Packs/day: 1.00    Years: 25.00    Types: Cigarettes    Quit date: 03/24/1987  . Smokeless tobacco: Never Used  . Alcohol use 0.0 oz/week     Comment: Socially; one drink per week.  . Drug use: No  . Sexual activity: Yes   Other Topics Concern  . 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  Married   No biological children    ALLERGIES: Patient has no known allergies.  MEDICATIONS:  Current Outpatient Prescriptions  Medication Sig Dispense Refill  . aspirin 81 MG tablet Take 81 mg by mouth daily.    . benazepril-hydrochlorthiazide (LOTENSIN HCT) 20-25 MG tablet Take 1 tablet by mouth daily. 90 tablet 3   No current facility-administered medications for this encounter.     REVIEW OF SYSTEMS:  On review of systems, the patient reports that he is doing well overall. He denies any chest pain, shortness of breath, cough, fevers, chills, night sweats, unintended weight changes. He denies any bowel disturbances, and denies abdominal pain, nausea or vomiting. He denies any new musculoskeletal or joint aches or pains other than that from arthritis.  His IPSS was 4, indicating mild urinary symptoms. The patient denies dysuria, hematuria, leakage or urinary incontinence. He is able to complete sexual activity with most attempts. A complete review of systems is obtained and is otherwise negative.   PHYSICAL EXAM:  Wt Readings from Last 3 Encounters:  08/05/16 210 lb 9.6 oz (95.5 kg)  04/23/16 222 lb 6.4 oz (100.9 kg)  04/29/15 232 lb (105.2 kg)   Temp Readings from Last 3 Encounters:  08/05/16 98 F (36.7 C) (Oral)  04/23/16 97.7 F (36.5 C) (Oral)  04/29/15 97.6 F (36.4 C) (Oral)   BP Readings from Last 3 Encounters:  08/05/16 129/76  04/23/16 110/64  04/29/15 110/70   Pulse Readings from Last 3 Encounters:  08/05/16 (!) 46  12/11/14 (!) 53   Pain Assessment Pain Score: 0-No pain/10  In general this is a well appearing Caucasian male in no acute distress. He is alert and oriented x4 and appropriate throughout the examination. HEENT reveals that the patient is normocephalic, atraumatic. Skin is intact without any evidence of gross lesions. Cardiovascular exam reveals a regular rate and rhythm, no clicks rubs or murmurs are auscultated. Chest is clear to auscultation bilaterally. Abdomen has active bowel sounds in all quadrants and is intact. The abdomen is soft, non tender, non distended. The lower extremities are without cyanosis, clubbing or pitting edema.   KPS = 100  100 - Normal; no complaints; no evidence of disease. 90   - Able to carry on normal activity; minor signs or symptoms of disease. 80   - Normal activity with effort; some signs or symptoms of disease. 48   - Cares for self; unable to carry on normal activity or to do active work. 60   - Requires occasional assistance, but is able to care for most of his personal needs. 50   - Requires considerable assistance and frequent medical care. 67   - Disabled; requires special care and assistance. 60   - Severely disabled; hospital admission is indicated although  death not imminent. 22   - Very sick; hospital admission necessary; active supportive treatment necessary. 10   - Moribund; fatal processes progressing rapidly. 0     - Dead  Karnofsky DA, Abelmann [place], Craver LS and Burchenal [facility] [phone]) The use of the nitrogen mustards in the palliative treatment of carcinoma: with particular reference to bronchogenic carcinoma Cancer 1 634-56  LABORATORY DATA:  Lab Results  Component Value Date   WBC 6.1 04/17/2016   HGB 12.6 (L) 04/17/2016   HCT 37.1 (L) 04/17/2016   MCV 88.3 04/17/2016   PLT 226.0 04/17/2016   Lab Results  Component Value Date   NA 138 04/17/2016   K 4.0 04/17/2016   CL  103 04/17/2016   CO2 31 04/17/2016   Lab Results  Component Value Date   ALT 13 04/17/2016   AST 10 04/17/2016   ALKPHOS 63 04/17/2016   BILITOT 0.9 04/17/2016     RADIOGRAPHY: No results found.    IMPRESSION/PLAN: 1. 71 y.o. 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  We discussed the pathology findings and reviewed the nature of prostate disease. We outlined the role that T Staging, Gleason Score, and PSA have in determining options for treatment. We compared the options for radiotherapy against surgery. We would recommend either radioactive seed implant versus external radiotherapy to the prostate. We discussed the risks, benefits, short, and long term effects of each radiotherapy option, and the patient is interested in considering his options but is leaning towards an 8 week course of external beam radiotherapy. We discussed the need for a follow up appointment with Dr. Louis Meckel for placement of gold seed markers prior to beginning EBRT. He will contact the clinic with his decision in the near future and we will work to coordinate his care going forward at that time including scheduling gold seed marker placement followed by [place] in the near future.    In a visit lasting 60  minutes, greater than 50% of the time was spent face to face discussing intermediate risk prostate cancer and his treatment options, and coordinating the patient's care.  We enjoyed meeting this nice gentleman and look forward to participating in his care.   Nicholos Johns, PA-C    Tyler Pita, MD  [facility] Direct Dial: [phone]  Fax: [phone] [email or web address]  Skype  [email or web address]  This document serves as a record of services personally performed by Tyler Pita, MD and Freeman Caldron, PA-C. It was created on their behalf by Maryla Morrow, a trained medical scribe. The creation of this record is based on the scribe's personal observations and the provider's statements to them. This document has been checked and approved by the attending provider.

## 2016-08-05 NOTE — Progress Notes (Signed)
See progress note under physician encounter. 

## 2016-08-06 ENCOUNTER — Telehealth: Payer: Self-pay | Admitting: Urology

## 2016-08-06 NOTE — Telephone Encounter (Signed)
-----   Message from Kerri Perches sent at 08/06/2016  2:51 PM EDT ----- Regarding: PHONE [place],   This patient requested a call from you. Jon Summers's phone number is [phone].  Thanks,  [employer]

## 2016-08-06 NOTE — Telephone Encounter (Signed)
I returned the patient's call regarding questions related to expected insurance coverage for EBRT vs Brachytherapy.  Apparently, he called and left a message with the billing department yesterday and has not heard back.  He is getting anxious as he would like to begin treatment as soon as possible.  I advised that I would pass this information along to the appropriate persons and have them call him back with the requested information.

## 2016-08-11 ENCOUNTER — Telehealth: Payer: Self-pay | Admitting: Urology

## 2016-08-11 NOTE — Telephone Encounter (Signed)
Attempted to return patient's call but no answer. I left a VMOM requesting that he call back in the morning if he had specific questions for me or that he could speak with Sam, RN if he is simply wanting to relay his treatment decision.

## 2016-08-20 ENCOUNTER — Telehealth: Payer: Self-pay | Admitting: *Deleted

## 2016-08-20 NOTE — Telephone Encounter (Signed)
Called patient to inform of gold seed placement on 09-29-16 @ 8 am @ Dr. Carlton Adam Office and his sim on 10-01-16 @ 1 pm @ Dr. Johny Shears Office, spoke with patient and he is aware of these appts.

## 2016-08-20 NOTE — Telephone Encounter (Signed)
Called patient to inform of gold seed placement on 09-22-16 - arrival time - 2:30 pm @ Dr. Carlton Adam Office and his sim on 09-25-16 @ 9 am @ Dr. Johny Shears Office, spoke with patient and he wants another day, due to be on vacation, told patient that I would call and get him another day

## 2016-09-25 ENCOUNTER — Ambulatory Visit: Payer: Medicare Other | Admitting: Radiation Oncology

## 2016-10-01 ENCOUNTER — Ambulatory Visit
Admission: RE | Admit: 2016-10-01 | Discharge: 2016-10-01 | Disposition: A | Discharge status: Home / Self Care | Payer: Medicare Other | Source: Ambulatory Visit | Attending: Radiation Oncology | Admitting: Radiation Oncology

## 2016-10-01 ENCOUNTER — Encounter: Payer: Self-pay | Admitting: Medical Oncology

## 2016-10-01 DIAGNOSIS — N189 Chronic kidney disease, unspecified: Secondary | ICD-10-CM | POA: Diagnosis not present

## 2016-10-01 DIAGNOSIS — Z79899 Other long term (current) drug therapy: Secondary | ICD-10-CM | POA: Diagnosis not present

## 2016-10-01 DIAGNOSIS — Z823 Family history of stroke: Secondary | ICD-10-CM | POA: Diagnosis not present

## 2016-10-01 DIAGNOSIS — M1990 Unspecified osteoarthritis, unspecified site: Secondary | ICD-10-CM | POA: Diagnosis not present

## 2016-10-01 DIAGNOSIS — Z87891 Personal history of nicotine dependence: Secondary | ICD-10-CM | POA: Diagnosis not present

## 2016-10-01 DIAGNOSIS — C61 Malignant neoplasm of prostate: Secondary | ICD-10-CM

## 2016-10-01 DIAGNOSIS — Z7982 Long term (current) use of aspirin: Secondary | ICD-10-CM | POA: Diagnosis not present

## 2016-10-01 DIAGNOSIS — E785 Hyperlipidemia, unspecified: Secondary | ICD-10-CM | POA: Diagnosis not present

## 2016-10-01 DIAGNOSIS — I129 Hypertensive chronic kidney disease with stage 1 through stage 4 chronic kidney disease, or unspecified chronic kidney disease: Secondary | ICD-10-CM | POA: Diagnosis not present

## 2016-10-01 DIAGNOSIS — I2510 Atherosclerotic heart disease of native coronary artery without angina pectoris: Secondary | ICD-10-CM | POA: Diagnosis not present

## 2016-10-01 DIAGNOSIS — Z510 Encounter for antineoplastic radiation therapy: Secondary | ICD-10-CM | POA: Diagnosis not present

## 2016-10-01 DIAGNOSIS — Z8042 Family history of malignant neoplasm of prostate: Secondary | ICD-10-CM | POA: Diagnosis not present

## 2016-10-01 NOTE — Progress Notes (Unsigned)
I introduced myself to Jon Summers as the prostate nurse navigator and my role. He is here today for CT simulation. I was unable to meet him the day he consulted with Dr. Tammi Klippel. I will continue to follow.

## 2016-10-01 NOTE — Progress Notes (Signed)
  Radiation Oncology         [phone] ________________________________  Name: Jon Summers MRN: [id number]  Date: 10/01/2016  DOB: [birth year]  SIMULATION AND TREATMENT PLANNING NOTE    ICD-10-CM   1. Malignant neoplasm of prostate ([place]) C61     DIAGNOSIS:  71 y.o. gentleman with stage T1c adenocarcinoma of the prostate with a Gleason Score of 3+4 and a PSA of 6.71  NARRATIVE:  The patient was brought to the [facility].  Identity was confirmed.  All relevant records and images related to the planned course of therapy were reviewed.  The patient freely provided informed written consent to proceed with treatment after reviewing the details related to the planned course of therapy. The consent form was witnessed and verified by the simulation staff.  Then, the patient was set-up in a stable reproducible supine position for radiation therapy.  A vacuum lock pillow device was custom fabricated to position his legs in a reproducible immobilized position.  Then, I performed a urethrogram under sterile conditions to identify the prostatic apex.  CT images were obtained.  Surface markings were placed.  The CT images were loaded into the planning software.  Then the prostate target and avoidance structures including the rectum, bladder, bowel and hips were contoured.  Treatment planning then occurred.  The radiation prescription was entered and confirmed.  A total of one complex treatment devices was fabricated. I have requested : Intensity Modulated Radiotherapy (IMRT) is medically necessary for this case for the following reason:  Rectal sparing.Marland Kitchen  PLAN:  The patient will receive 78 Gy in 40 fractions.  ________________________________  Sheral Apley Tammi Klippel, M.D.   This document serves as a record of services personally performed by Tyler Pita MD. It was created on his behalf by Delton Coombes, a trained medical scribe. The creation of this record is based on the scribe's  personal observations and the provider's statements to them. This document has been checked and approved by the attending provider.

## 2016-10-06 ENCOUNTER — Telehealth: Payer: Self-pay | Admitting: Radiation Oncology

## 2016-10-06 NOTE — Telephone Encounter (Signed)
Returned patient's call. Patient requesting to start radiation therapy on 7/31 instead of 7/24. Patient understands this RN will inform Dr. Tammi Klippel of his request and the therapist on L2 will be in contact this his treatment time for that day should Dr. Tammi Klippel agree. Patient verbalized understanding.

## 2016-10-08 DIAGNOSIS — Z510 Encounter for antineoplastic radiation therapy: Secondary | ICD-10-CM | POA: Diagnosis not present

## 2016-10-08 DIAGNOSIS — Z87891 Personal history of nicotine dependence: Secondary | ICD-10-CM | POA: Diagnosis not present

## 2016-10-08 DIAGNOSIS — Z8042 Family history of malignant neoplasm of prostate: Secondary | ICD-10-CM | POA: Diagnosis not present

## 2016-10-08 DIAGNOSIS — C61 Malignant neoplasm of prostate: Secondary | ICD-10-CM | POA: Diagnosis not present

## 2016-10-08 DIAGNOSIS — I2510 Atherosclerotic heart disease of native coronary artery without angina pectoris: Secondary | ICD-10-CM | POA: Diagnosis not present

## 2016-10-08 DIAGNOSIS — Z7982 Long term (current) use of aspirin: Secondary | ICD-10-CM | POA: Diagnosis not present

## 2016-10-08 DIAGNOSIS — Z823 Family history of stroke: Secondary | ICD-10-CM | POA: Diagnosis not present

## 2016-10-08 DIAGNOSIS — E785 Hyperlipidemia, unspecified: Secondary | ICD-10-CM | POA: Diagnosis not present

## 2016-10-08 DIAGNOSIS — I129 Hypertensive chronic kidney disease with stage 1 through stage 4 chronic kidney disease, or unspecified chronic kidney disease: Secondary | ICD-10-CM | POA: Diagnosis not present

## 2016-10-08 DIAGNOSIS — Z79899 Other long term (current) drug therapy: Secondary | ICD-10-CM | POA: Diagnosis not present

## 2016-10-08 DIAGNOSIS — M1990 Unspecified osteoarthritis, unspecified site: Secondary | ICD-10-CM | POA: Diagnosis not present

## 2016-10-08 DIAGNOSIS — N189 Chronic kidney disease, unspecified: Secondary | ICD-10-CM | POA: Diagnosis not present

## 2016-10-12 ENCOUNTER — Ambulatory Visit: Payer: Medicare Other | Admitting: Radiation Oncology

## 2016-10-13 ENCOUNTER — Ambulatory Visit: Payer: Medicare Other

## 2016-10-13 ENCOUNTER — Ambulatory Visit: Payer: Medicare Other | Admitting: Radiation Oncology

## 2016-10-14 ENCOUNTER — Ambulatory Visit: Payer: Medicare Other

## 2016-10-15 ENCOUNTER — Ambulatory Visit: Payer: Medicare Other

## 2016-10-16 ENCOUNTER — Ambulatory Visit: Payer: Medicare Other

## 2016-10-19 ENCOUNTER — Ambulatory Visit: Payer: Medicare Other

## 2016-10-20 ENCOUNTER — Encounter: Payer: Self-pay | Admitting: Medical Oncology

## 2016-10-20 ENCOUNTER — Ambulatory Visit
Admission: RE | Admit: 2016-10-20 | Discharge: 2016-10-20 | Disposition: A | Discharge status: Home / Self Care | Payer: Medicare Other | Source: Ambulatory Visit | Attending: Radiation Oncology | Admitting: Radiation Oncology

## 2016-10-20 DIAGNOSIS — M1990 Unspecified osteoarthritis, unspecified site: Secondary | ICD-10-CM | POA: Diagnosis not present

## 2016-10-20 DIAGNOSIS — I129 Hypertensive chronic kidney disease with stage 1 through stage 4 chronic kidney disease, or unspecified chronic kidney disease: Secondary | ICD-10-CM | POA: Diagnosis not present

## 2016-10-20 DIAGNOSIS — I2510 Atherosclerotic heart disease of native coronary artery without angina pectoris: Secondary | ICD-10-CM | POA: Diagnosis not present

## 2016-10-20 DIAGNOSIS — E785 Hyperlipidemia, unspecified: Secondary | ICD-10-CM | POA: Diagnosis not present

## 2016-10-20 DIAGNOSIS — Z510 Encounter for antineoplastic radiation therapy: Secondary | ICD-10-CM | POA: Diagnosis not present

## 2016-10-20 DIAGNOSIS — Z8042 Family history of malignant neoplasm of prostate: Secondary | ICD-10-CM | POA: Diagnosis not present

## 2016-10-20 DIAGNOSIS — Z87891 Personal history of nicotine dependence: Secondary | ICD-10-CM | POA: Diagnosis not present

## 2016-10-20 DIAGNOSIS — Z79899 Other long term (current) drug therapy: Secondary | ICD-10-CM | POA: Diagnosis not present

## 2016-10-20 DIAGNOSIS — Z823 Family history of stroke: Secondary | ICD-10-CM | POA: Diagnosis not present

## 2016-10-20 DIAGNOSIS — C61 Malignant neoplasm of prostate: Secondary | ICD-10-CM | POA: Diagnosis not present

## 2016-10-20 DIAGNOSIS — N189 Chronic kidney disease, unspecified: Secondary | ICD-10-CM | POA: Diagnosis not present

## 2016-10-20 DIAGNOSIS — Z7982 Long term (current) use of aspirin: Secondary | ICD-10-CM | POA: Diagnosis not present

## 2016-10-21 ENCOUNTER — Ambulatory Visit
Admission: RE | Admit: 2016-10-21 | Discharge: 2016-10-21 | Disposition: A | Discharge status: Home / Self Care | Payer: Medicare Other | Source: Ambulatory Visit | Attending: Radiation Oncology | Admitting: Radiation Oncology

## 2016-10-21 DIAGNOSIS — N189 Chronic kidney disease, unspecified: Secondary | ICD-10-CM | POA: Diagnosis not present

## 2016-10-21 DIAGNOSIS — Z823 Family history of stroke: Secondary | ICD-10-CM | POA: Diagnosis not present

## 2016-10-21 DIAGNOSIS — Z8042 Family history of malignant neoplasm of prostate: Secondary | ICD-10-CM | POA: Diagnosis not present

## 2016-10-21 DIAGNOSIS — Z87891 Personal history of nicotine dependence: Secondary | ICD-10-CM | POA: Diagnosis not present

## 2016-10-21 DIAGNOSIS — Z510 Encounter for antineoplastic radiation therapy: Secondary | ICD-10-CM | POA: Diagnosis not present

## 2016-10-21 DIAGNOSIS — Z79899 Other long term (current) drug therapy: Secondary | ICD-10-CM | POA: Diagnosis not present

## 2016-10-21 DIAGNOSIS — C61 Malignant neoplasm of prostate: Secondary | ICD-10-CM | POA: Diagnosis not present

## 2016-10-21 DIAGNOSIS — E785 Hyperlipidemia, unspecified: Secondary | ICD-10-CM | POA: Diagnosis not present

## 2016-10-21 DIAGNOSIS — M1990 Unspecified osteoarthritis, unspecified site: Secondary | ICD-10-CM | POA: Diagnosis not present

## 2016-10-21 DIAGNOSIS — I2510 Atherosclerotic heart disease of native coronary artery without angina pectoris: Secondary | ICD-10-CM | POA: Diagnosis not present

## 2016-10-21 DIAGNOSIS — I129 Hypertensive chronic kidney disease with stage 1 through stage 4 chronic kidney disease, or unspecified chronic kidney disease: Secondary | ICD-10-CM | POA: Diagnosis not present

## 2016-10-21 DIAGNOSIS — Z7982 Long term (current) use of aspirin: Secondary | ICD-10-CM | POA: Diagnosis not present

## 2016-10-22 ENCOUNTER — Ambulatory Visit
Admission: RE | Admit: 2016-10-22 | Discharge: 2016-10-22 | Disposition: A | Discharge status: Home / Self Care | Payer: Medicare Other | Source: Ambulatory Visit | Attending: Radiation Oncology | Admitting: Radiation Oncology

## 2016-10-22 DIAGNOSIS — Z7982 Long term (current) use of aspirin: Secondary | ICD-10-CM | POA: Diagnosis not present

## 2016-10-22 DIAGNOSIS — M1990 Unspecified osteoarthritis, unspecified site: Secondary | ICD-10-CM | POA: Diagnosis not present

## 2016-10-22 DIAGNOSIS — Z79899 Other long term (current) drug therapy: Secondary | ICD-10-CM | POA: Diagnosis not present

## 2016-10-22 DIAGNOSIS — E785 Hyperlipidemia, unspecified: Secondary | ICD-10-CM | POA: Diagnosis not present

## 2016-10-22 DIAGNOSIS — I129 Hypertensive chronic kidney disease with stage 1 through stage 4 chronic kidney disease, or unspecified chronic kidney disease: Secondary | ICD-10-CM | POA: Diagnosis not present

## 2016-10-22 DIAGNOSIS — Z87891 Personal history of nicotine dependence: Secondary | ICD-10-CM | POA: Diagnosis not present

## 2016-10-22 DIAGNOSIS — Z8042 Family history of malignant neoplasm of prostate: Secondary | ICD-10-CM | POA: Diagnosis not present

## 2016-10-22 DIAGNOSIS — C61 Malignant neoplasm of prostate: Secondary | ICD-10-CM | POA: Diagnosis not present

## 2016-10-22 DIAGNOSIS — Z823 Family history of stroke: Secondary | ICD-10-CM | POA: Diagnosis not present

## 2016-10-22 DIAGNOSIS — Z510 Encounter for antineoplastic radiation therapy: Secondary | ICD-10-CM | POA: Diagnosis not present

## 2016-10-22 DIAGNOSIS — N189 Chronic kidney disease, unspecified: Secondary | ICD-10-CM | POA: Diagnosis not present

## 2016-10-22 DIAGNOSIS — I2510 Atherosclerotic heart disease of native coronary artery without angina pectoris: Secondary | ICD-10-CM | POA: Diagnosis not present

## 2016-10-23 ENCOUNTER — Ambulatory Visit
Admission: RE | Admit: 2016-10-23 | Discharge: 2016-10-23 | Disposition: A | Discharge status: Home / Self Care | Payer: Medicare Other | Source: Ambulatory Visit | Attending: Radiation Oncology | Admitting: Radiation Oncology

## 2016-10-23 DIAGNOSIS — N189 Chronic kidney disease, unspecified: Secondary | ICD-10-CM | POA: Diagnosis not present

## 2016-10-23 DIAGNOSIS — I2510 Atherosclerotic heart disease of native coronary artery without angina pectoris: Secondary | ICD-10-CM | POA: Diagnosis not present

## 2016-10-23 DIAGNOSIS — C61 Malignant neoplasm of prostate: Secondary | ICD-10-CM | POA: Diagnosis not present

## 2016-10-23 DIAGNOSIS — Z510 Encounter for antineoplastic radiation therapy: Secondary | ICD-10-CM | POA: Diagnosis not present

## 2016-10-23 DIAGNOSIS — Z79899 Other long term (current) drug therapy: Secondary | ICD-10-CM | POA: Diagnosis not present

## 2016-10-23 DIAGNOSIS — I129 Hypertensive chronic kidney disease with stage 1 through stage 4 chronic kidney disease, or unspecified chronic kidney disease: Secondary | ICD-10-CM | POA: Diagnosis not present

## 2016-10-23 DIAGNOSIS — Z8042 Family history of malignant neoplasm of prostate: Secondary | ICD-10-CM | POA: Diagnosis not present

## 2016-10-23 DIAGNOSIS — Z87891 Personal history of nicotine dependence: Secondary | ICD-10-CM | POA: Diagnosis not present

## 2016-10-23 DIAGNOSIS — Z823 Family history of stroke: Secondary | ICD-10-CM | POA: Diagnosis not present

## 2016-10-23 DIAGNOSIS — E785 Hyperlipidemia, unspecified: Secondary | ICD-10-CM | POA: Diagnosis not present

## 2016-10-23 DIAGNOSIS — M1990 Unspecified osteoarthritis, unspecified site: Secondary | ICD-10-CM | POA: Diagnosis not present

## 2016-10-23 DIAGNOSIS — Z7982 Long term (current) use of aspirin: Secondary | ICD-10-CM | POA: Diagnosis not present

## 2016-10-25 ENCOUNTER — Ambulatory Visit: Payer: Medicare Other

## 2016-10-26 ENCOUNTER — Ambulatory Visit
Admission: RE | Admit: 2016-10-26 | Discharge: 2016-10-26 | Disposition: A | Discharge status: Home / Self Care | Payer: Medicare Other | Source: Ambulatory Visit | Attending: Radiation Oncology | Admitting: Radiation Oncology

## 2016-10-26 DIAGNOSIS — Z79899 Other long term (current) drug therapy: Secondary | ICD-10-CM | POA: Diagnosis not present

## 2016-10-26 DIAGNOSIS — E785 Hyperlipidemia, unspecified: Secondary | ICD-10-CM | POA: Diagnosis not present

## 2016-10-26 DIAGNOSIS — Z7982 Long term (current) use of aspirin: Secondary | ICD-10-CM | POA: Diagnosis not present

## 2016-10-26 DIAGNOSIS — I129 Hypertensive chronic kidney disease with stage 1 through stage 4 chronic kidney disease, or unspecified chronic kidney disease: Secondary | ICD-10-CM | POA: Diagnosis not present

## 2016-10-26 DIAGNOSIS — Z87891 Personal history of nicotine dependence: Secondary | ICD-10-CM | POA: Diagnosis not present

## 2016-10-26 DIAGNOSIS — I2510 Atherosclerotic heart disease of native coronary artery without angina pectoris: Secondary | ICD-10-CM | POA: Diagnosis not present

## 2016-10-26 DIAGNOSIS — C61 Malignant neoplasm of prostate: Secondary | ICD-10-CM | POA: Diagnosis not present

## 2016-10-26 DIAGNOSIS — N189 Chronic kidney disease, unspecified: Secondary | ICD-10-CM | POA: Diagnosis not present

## 2016-10-26 DIAGNOSIS — Z510 Encounter for antineoplastic radiation therapy: Secondary | ICD-10-CM | POA: Diagnosis not present

## 2016-10-26 DIAGNOSIS — Z823 Family history of stroke: Secondary | ICD-10-CM | POA: Diagnosis not present

## 2016-10-26 DIAGNOSIS — Z8042 Family history of malignant neoplasm of prostate: Secondary | ICD-10-CM | POA: Diagnosis not present

## 2016-10-26 DIAGNOSIS — M1990 Unspecified osteoarthritis, unspecified site: Secondary | ICD-10-CM | POA: Diagnosis not present

## 2016-10-27 ENCOUNTER — Ambulatory Visit
Admission: RE | Admit: 2016-10-27 | Discharge: 2016-10-27 | Disposition: A | Discharge status: Home / Self Care | Payer: Medicare Other | Source: Ambulatory Visit | Attending: Radiation Oncology | Admitting: Radiation Oncology

## 2016-10-27 DIAGNOSIS — Z8042 Family history of malignant neoplasm of prostate: Secondary | ICD-10-CM | POA: Diagnosis not present

## 2016-10-27 DIAGNOSIS — Z510 Encounter for antineoplastic radiation therapy: Secondary | ICD-10-CM | POA: Diagnosis not present

## 2016-10-27 DIAGNOSIS — E785 Hyperlipidemia, unspecified: Secondary | ICD-10-CM | POA: Diagnosis not present

## 2016-10-27 DIAGNOSIS — C61 Malignant neoplasm of prostate: Secondary | ICD-10-CM | POA: Diagnosis not present

## 2016-10-27 DIAGNOSIS — Z823 Family history of stroke: Secondary | ICD-10-CM | POA: Diagnosis not present

## 2016-10-27 DIAGNOSIS — M1990 Unspecified osteoarthritis, unspecified site: Secondary | ICD-10-CM | POA: Diagnosis not present

## 2016-10-27 DIAGNOSIS — Z7982 Long term (current) use of aspirin: Secondary | ICD-10-CM | POA: Diagnosis not present

## 2016-10-27 DIAGNOSIS — N189 Chronic kidney disease, unspecified: Secondary | ICD-10-CM | POA: Diagnosis not present

## 2016-10-27 DIAGNOSIS — I129 Hypertensive chronic kidney disease with stage 1 through stage 4 chronic kidney disease, or unspecified chronic kidney disease: Secondary | ICD-10-CM | POA: Diagnosis not present

## 2016-10-27 DIAGNOSIS — Z87891 Personal history of nicotine dependence: Secondary | ICD-10-CM | POA: Diagnosis not present

## 2016-10-27 DIAGNOSIS — Z79899 Other long term (current) drug therapy: Secondary | ICD-10-CM | POA: Diagnosis not present

## 2016-10-27 DIAGNOSIS — I2510 Atherosclerotic heart disease of native coronary artery without angina pectoris: Secondary | ICD-10-CM | POA: Diagnosis not present

## 2016-10-28 ENCOUNTER — Ambulatory Visit
Admission: RE | Admit: 2016-10-28 | Discharge: 2016-10-28 | Disposition: A | Discharge status: Home / Self Care | Payer: Medicare Other | Source: Ambulatory Visit | Attending: Radiation Oncology | Admitting: Radiation Oncology

## 2016-10-28 DIAGNOSIS — Z79899 Other long term (current) drug therapy: Secondary | ICD-10-CM | POA: Diagnosis not present

## 2016-10-28 DIAGNOSIS — I129 Hypertensive chronic kidney disease with stage 1 through stage 4 chronic kidney disease, or unspecified chronic kidney disease: Secondary | ICD-10-CM | POA: Diagnosis not present

## 2016-10-28 DIAGNOSIS — Z510 Encounter for antineoplastic radiation therapy: Secondary | ICD-10-CM | POA: Diagnosis not present

## 2016-10-28 DIAGNOSIS — Z87891 Personal history of nicotine dependence: Secondary | ICD-10-CM | POA: Diagnosis not present

## 2016-10-28 DIAGNOSIS — C61 Malignant neoplasm of prostate: Secondary | ICD-10-CM | POA: Diagnosis not present

## 2016-10-28 DIAGNOSIS — Z823 Family history of stroke: Secondary | ICD-10-CM | POA: Diagnosis not present

## 2016-10-28 DIAGNOSIS — Z7982 Long term (current) use of aspirin: Secondary | ICD-10-CM | POA: Diagnosis not present

## 2016-10-28 DIAGNOSIS — M1990 Unspecified osteoarthritis, unspecified site: Secondary | ICD-10-CM | POA: Diagnosis not present

## 2016-10-28 DIAGNOSIS — Z8042 Family history of malignant neoplasm of prostate: Secondary | ICD-10-CM | POA: Diagnosis not present

## 2016-10-28 DIAGNOSIS — E785 Hyperlipidemia, unspecified: Secondary | ICD-10-CM | POA: Diagnosis not present

## 2016-10-28 DIAGNOSIS — N189 Chronic kidney disease, unspecified: Secondary | ICD-10-CM | POA: Diagnosis not present

## 2016-10-28 DIAGNOSIS — I2510 Atherosclerotic heart disease of native coronary artery without angina pectoris: Secondary | ICD-10-CM | POA: Diagnosis not present

## 2016-10-29 ENCOUNTER — Ambulatory Visit
Admission: RE | Admit: 2016-10-29 | Discharge: 2016-10-29 | Disposition: A | Discharge status: Home / Self Care | Payer: Medicare Other | Source: Ambulatory Visit | Attending: Radiation Oncology | Admitting: Radiation Oncology

## 2016-10-29 DIAGNOSIS — Z79899 Other long term (current) drug therapy: Secondary | ICD-10-CM | POA: Diagnosis not present

## 2016-10-29 DIAGNOSIS — E785 Hyperlipidemia, unspecified: Secondary | ICD-10-CM | POA: Diagnosis not present

## 2016-10-29 DIAGNOSIS — Z8042 Family history of malignant neoplasm of prostate: Secondary | ICD-10-CM | POA: Diagnosis not present

## 2016-10-29 DIAGNOSIS — N189 Chronic kidney disease, unspecified: Secondary | ICD-10-CM | POA: Diagnosis not present

## 2016-10-29 DIAGNOSIS — I129 Hypertensive chronic kidney disease with stage 1 through stage 4 chronic kidney disease, or unspecified chronic kidney disease: Secondary | ICD-10-CM | POA: Diagnosis not present

## 2016-10-29 DIAGNOSIS — Z87891 Personal history of nicotine dependence: Secondary | ICD-10-CM | POA: Diagnosis not present

## 2016-10-29 DIAGNOSIS — Z823 Family history of stroke: Secondary | ICD-10-CM | POA: Diagnosis not present

## 2016-10-29 DIAGNOSIS — I2510 Atherosclerotic heart disease of native coronary artery without angina pectoris: Secondary | ICD-10-CM | POA: Diagnosis not present

## 2016-10-29 DIAGNOSIS — C61 Malignant neoplasm of prostate: Secondary | ICD-10-CM | POA: Diagnosis not present

## 2016-10-29 DIAGNOSIS — Z7982 Long term (current) use of aspirin: Secondary | ICD-10-CM | POA: Diagnosis not present

## 2016-10-29 DIAGNOSIS — Z510 Encounter for antineoplastic radiation therapy: Secondary | ICD-10-CM | POA: Diagnosis not present

## 2016-10-29 DIAGNOSIS — M1990 Unspecified osteoarthritis, unspecified site: Secondary | ICD-10-CM | POA: Diagnosis not present

## 2016-10-30 ENCOUNTER — Ambulatory Visit
Admission: RE | Admit: 2016-10-30 | Discharge: 2016-10-30 | Disposition: A | Discharge status: Home / Self Care | Payer: Medicare Other | Source: Ambulatory Visit | Attending: Radiation Oncology | Admitting: Radiation Oncology

## 2016-10-30 DIAGNOSIS — N189 Chronic kidney disease, unspecified: Secondary | ICD-10-CM | POA: Diagnosis not present

## 2016-10-30 DIAGNOSIS — Z8042 Family history of malignant neoplasm of prostate: Secondary | ICD-10-CM | POA: Diagnosis not present

## 2016-10-30 DIAGNOSIS — Z79899 Other long term (current) drug therapy: Secondary | ICD-10-CM | POA: Diagnosis not present

## 2016-10-30 DIAGNOSIS — I2510 Atherosclerotic heart disease of native coronary artery without angina pectoris: Secondary | ICD-10-CM | POA: Diagnosis not present

## 2016-10-30 DIAGNOSIS — I129 Hypertensive chronic kidney disease with stage 1 through stage 4 chronic kidney disease, or unspecified chronic kidney disease: Secondary | ICD-10-CM | POA: Diagnosis not present

## 2016-10-30 DIAGNOSIS — E785 Hyperlipidemia, unspecified: Secondary | ICD-10-CM | POA: Diagnosis not present

## 2016-10-30 DIAGNOSIS — Z823 Family history of stroke: Secondary | ICD-10-CM | POA: Diagnosis not present

## 2016-10-30 DIAGNOSIS — Z7982 Long term (current) use of aspirin: Secondary | ICD-10-CM | POA: Diagnosis not present

## 2016-10-30 DIAGNOSIS — Z87891 Personal history of nicotine dependence: Secondary | ICD-10-CM | POA: Diagnosis not present

## 2016-10-30 DIAGNOSIS — M1990 Unspecified osteoarthritis, unspecified site: Secondary | ICD-10-CM | POA: Diagnosis not present

## 2016-10-30 DIAGNOSIS — Z510 Encounter for antineoplastic radiation therapy: Secondary | ICD-10-CM | POA: Diagnosis not present

## 2016-10-30 DIAGNOSIS — C61 Malignant neoplasm of prostate: Secondary | ICD-10-CM | POA: Diagnosis not present

## 2016-11-02 ENCOUNTER — Ambulatory Visit
Admission: RE | Admit: 2016-11-02 | Discharge: 2016-11-02 | Disposition: A | Discharge status: Home / Self Care | Payer: Medicare Other | Source: Ambulatory Visit | Attending: Radiation Oncology | Admitting: Radiation Oncology

## 2016-11-02 DIAGNOSIS — Z79899 Other long term (current) drug therapy: Secondary | ICD-10-CM | POA: Diagnosis not present

## 2016-11-02 DIAGNOSIS — Z7982 Long term (current) use of aspirin: Secondary | ICD-10-CM | POA: Diagnosis not present

## 2016-11-02 DIAGNOSIS — C61 Malignant neoplasm of prostate: Secondary | ICD-10-CM | POA: Insufficient documentation

## 2016-11-02 DIAGNOSIS — Z510 Encounter for antineoplastic radiation therapy: Secondary | ICD-10-CM | POA: Diagnosis not present

## 2016-11-03 ENCOUNTER — Ambulatory Visit
Admission: RE | Admit: 2016-11-03 | Discharge: 2016-11-03 | Disposition: A | Discharge status: Home / Self Care | Payer: Medicare Other | Source: Ambulatory Visit | Attending: Radiation Oncology | Admitting: Radiation Oncology

## 2016-11-03 DIAGNOSIS — C61 Malignant neoplasm of prostate: Secondary | ICD-10-CM | POA: Diagnosis not present

## 2016-11-03 DIAGNOSIS — Z510 Encounter for antineoplastic radiation therapy: Secondary | ICD-10-CM | POA: Diagnosis not present

## 2016-11-03 DIAGNOSIS — Z79899 Other long term (current) drug therapy: Secondary | ICD-10-CM | POA: Diagnosis not present

## 2016-11-03 DIAGNOSIS — Z7982 Long term (current) use of aspirin: Secondary | ICD-10-CM | POA: Diagnosis not present

## 2016-11-04 ENCOUNTER — Ambulatory Visit
Admission: RE | Admit: 2016-11-04 | Discharge: 2016-11-04 | Disposition: A | Discharge status: Home / Self Care | Payer: Medicare Other | Source: Ambulatory Visit | Attending: Radiation Oncology | Admitting: Radiation Oncology

## 2016-11-04 DIAGNOSIS — Z7982 Long term (current) use of aspirin: Secondary | ICD-10-CM | POA: Diagnosis not present

## 2016-11-04 DIAGNOSIS — C61 Malignant neoplasm of prostate: Secondary | ICD-10-CM | POA: Diagnosis not present

## 2016-11-04 DIAGNOSIS — Z510 Encounter for antineoplastic radiation therapy: Secondary | ICD-10-CM | POA: Diagnosis not present

## 2016-11-04 DIAGNOSIS — Z79899 Other long term (current) drug therapy: Secondary | ICD-10-CM | POA: Diagnosis not present

## 2016-11-05 ENCOUNTER — Ambulatory Visit
Admission: RE | Admit: 2016-11-05 | Discharge: 2016-11-05 | Disposition: A | Discharge status: Home / Self Care | Payer: Medicare Other | Source: Ambulatory Visit | Attending: Radiation Oncology | Admitting: Radiation Oncology

## 2016-11-05 DIAGNOSIS — Z510 Encounter for antineoplastic radiation therapy: Secondary | ICD-10-CM | POA: Diagnosis not present

## 2016-11-05 DIAGNOSIS — Z79899 Other long term (current) drug therapy: Secondary | ICD-10-CM | POA: Diagnosis not present

## 2016-11-05 DIAGNOSIS — C61 Malignant neoplasm of prostate: Secondary | ICD-10-CM | POA: Diagnosis not present

## 2016-11-05 DIAGNOSIS — Z7982 Long term (current) use of aspirin: Secondary | ICD-10-CM | POA: Diagnosis not present

## 2016-11-06 ENCOUNTER — Ambulatory Visit
Admission: RE | Admit: 2016-11-06 | Discharge: 2016-11-06 | Disposition: A | Discharge status: Home / Self Care | Payer: Medicare Other | Source: Ambulatory Visit | Attending: Radiation Oncology | Admitting: Radiation Oncology

## 2016-11-06 DIAGNOSIS — Z510 Encounter for antineoplastic radiation therapy: Secondary | ICD-10-CM | POA: Diagnosis not present

## 2016-11-06 DIAGNOSIS — C61 Malignant neoplasm of prostate: Secondary | ICD-10-CM | POA: Diagnosis not present

## 2016-11-06 DIAGNOSIS — Z79899 Other long term (current) drug therapy: Secondary | ICD-10-CM | POA: Diagnosis not present

## 2016-11-06 DIAGNOSIS — Z7982 Long term (current) use of aspirin: Secondary | ICD-10-CM | POA: Diagnosis not present

## 2016-11-09 ENCOUNTER — Ambulatory Visit
Admission: RE | Admit: 2016-11-09 | Discharge: 2016-11-09 | Disposition: A | Discharge status: Home / Self Care | Payer: Medicare Other | Source: Ambulatory Visit | Attending: Radiation Oncology | Admitting: Radiation Oncology

## 2016-11-09 DIAGNOSIS — Z7982 Long term (current) use of aspirin: Secondary | ICD-10-CM | POA: Diagnosis not present

## 2016-11-09 DIAGNOSIS — Z79899 Other long term (current) drug therapy: Secondary | ICD-10-CM | POA: Diagnosis not present

## 2016-11-09 DIAGNOSIS — Z510 Encounter for antineoplastic radiation therapy: Secondary | ICD-10-CM | POA: Diagnosis not present

## 2016-11-09 DIAGNOSIS — C61 Malignant neoplasm of prostate: Secondary | ICD-10-CM | POA: Diagnosis not present

## 2016-11-10 ENCOUNTER — Ambulatory Visit
Admission: RE | Admit: 2016-11-10 | Discharge: 2016-11-10 | Disposition: A | Discharge status: Home / Self Care | Payer: Medicare Other | Source: Ambulatory Visit | Attending: Radiation Oncology | Admitting: Radiation Oncology

## 2016-11-10 DIAGNOSIS — Z79899 Other long term (current) drug therapy: Secondary | ICD-10-CM | POA: Diagnosis not present

## 2016-11-10 DIAGNOSIS — C61 Malignant neoplasm of prostate: Secondary | ICD-10-CM | POA: Diagnosis not present

## 2016-11-10 DIAGNOSIS — Z510 Encounter for antineoplastic radiation therapy: Secondary | ICD-10-CM | POA: Diagnosis not present

## 2016-11-10 DIAGNOSIS — Z7982 Long term (current) use of aspirin: Secondary | ICD-10-CM | POA: Diagnosis not present

## 2016-11-11 ENCOUNTER — Ambulatory Visit
Admission: RE | Admit: 2016-11-11 | Discharge: 2016-11-11 | Disposition: A | Discharge status: Home / Self Care | Payer: Medicare Other | Source: Ambulatory Visit | Attending: Radiation Oncology | Admitting: Radiation Oncology

## 2016-11-11 DIAGNOSIS — Z79899 Other long term (current) drug therapy: Secondary | ICD-10-CM | POA: Diagnosis not present

## 2016-11-11 DIAGNOSIS — Z7982 Long term (current) use of aspirin: Secondary | ICD-10-CM | POA: Diagnosis not present

## 2016-11-11 DIAGNOSIS — C61 Malignant neoplasm of prostate: Secondary | ICD-10-CM | POA: Diagnosis not present

## 2016-11-11 DIAGNOSIS — Z510 Encounter for antineoplastic radiation therapy: Secondary | ICD-10-CM | POA: Diagnosis not present

## 2016-11-12 ENCOUNTER — Ambulatory Visit
Admission: RE | Admit: 2016-11-12 | Discharge: 2016-11-12 | Disposition: A | Discharge status: Home / Self Care | Payer: Medicare Other | Source: Ambulatory Visit | Attending: Radiation Oncology | Admitting: Radiation Oncology

## 2016-11-12 DIAGNOSIS — C61 Malignant neoplasm of prostate: Secondary | ICD-10-CM | POA: Diagnosis not present

## 2016-11-12 DIAGNOSIS — Z510 Encounter for antineoplastic radiation therapy: Secondary | ICD-10-CM | POA: Diagnosis not present

## 2016-11-12 DIAGNOSIS — Z79899 Other long term (current) drug therapy: Secondary | ICD-10-CM | POA: Diagnosis not present

## 2016-11-12 DIAGNOSIS — Z7982 Long term (current) use of aspirin: Secondary | ICD-10-CM | POA: Diagnosis not present

## 2016-11-13 ENCOUNTER — Ambulatory Visit
Admission: RE | Admit: 2016-11-13 | Discharge: 2016-11-13 | Disposition: A | Discharge status: Home / Self Care | Payer: Medicare Other | Source: Ambulatory Visit | Attending: Radiation Oncology | Admitting: Radiation Oncology

## 2016-11-13 DIAGNOSIS — Z7982 Long term (current) use of aspirin: Secondary | ICD-10-CM | POA: Diagnosis not present

## 2016-11-13 DIAGNOSIS — Z79899 Other long term (current) drug therapy: Secondary | ICD-10-CM | POA: Diagnosis not present

## 2016-11-13 DIAGNOSIS — Z510 Encounter for antineoplastic radiation therapy: Secondary | ICD-10-CM | POA: Diagnosis not present

## 2016-11-13 DIAGNOSIS — C61 Malignant neoplasm of prostate: Secondary | ICD-10-CM | POA: Diagnosis not present

## 2016-11-16 ENCOUNTER — Ambulatory Visit
Admission: RE | Admit: 2016-11-16 | Discharge: 2016-11-16 | Disposition: A | Discharge status: Home / Self Care | Payer: Medicare Other | Source: Ambulatory Visit | Attending: Radiation Oncology | Admitting: Radiation Oncology

## 2016-11-16 DIAGNOSIS — Z510 Encounter for antineoplastic radiation therapy: Secondary | ICD-10-CM | POA: Diagnosis not present

## 2016-11-16 DIAGNOSIS — C61 Malignant neoplasm of prostate: Secondary | ICD-10-CM | POA: Diagnosis not present

## 2016-11-16 DIAGNOSIS — Z7982 Long term (current) use of aspirin: Secondary | ICD-10-CM | POA: Diagnosis not present

## 2016-11-16 DIAGNOSIS — Z79899 Other long term (current) drug therapy: Secondary | ICD-10-CM | POA: Diagnosis not present

## 2016-11-17 ENCOUNTER — Ambulatory Visit
Admission: RE | Admit: 2016-11-17 | Discharge: 2016-11-17 | Disposition: A | Discharge status: Home / Self Care | Payer: Medicare Other | Source: Ambulatory Visit | Attending: Radiation Oncology | Admitting: Radiation Oncology

## 2016-11-17 DIAGNOSIS — Z79899 Other long term (current) drug therapy: Secondary | ICD-10-CM | POA: Diagnosis not present

## 2016-11-17 DIAGNOSIS — Z510 Encounter for antineoplastic radiation therapy: Secondary | ICD-10-CM | POA: Diagnosis not present

## 2016-11-17 DIAGNOSIS — C61 Malignant neoplasm of prostate: Secondary | ICD-10-CM | POA: Diagnosis not present

## 2016-11-17 DIAGNOSIS — Z7982 Long term (current) use of aspirin: Secondary | ICD-10-CM | POA: Diagnosis not present

## 2016-11-18 ENCOUNTER — Ambulatory Visit
Admission: RE | Admit: 2016-11-18 | Discharge: 2016-11-18 | Disposition: A | Discharge status: Home / Self Care | Payer: Medicare Other | Source: Ambulatory Visit | Attending: Radiation Oncology | Admitting: Radiation Oncology

## 2016-11-18 DIAGNOSIS — Z7982 Long term (current) use of aspirin: Secondary | ICD-10-CM | POA: Diagnosis not present

## 2016-11-18 DIAGNOSIS — C61 Malignant neoplasm of prostate: Secondary | ICD-10-CM | POA: Diagnosis not present

## 2016-11-18 DIAGNOSIS — Z510 Encounter for antineoplastic radiation therapy: Secondary | ICD-10-CM | POA: Diagnosis not present

## 2016-11-18 DIAGNOSIS — Z79899 Other long term (current) drug therapy: Secondary | ICD-10-CM | POA: Diagnosis not present

## 2016-11-19 ENCOUNTER — Ambulatory Visit
Admission: RE | Admit: 2016-11-19 | Discharge: 2016-11-19 | Disposition: A | Discharge status: Home / Self Care | Payer: Medicare Other | Source: Ambulatory Visit | Attending: Radiation Oncology | Admitting: Radiation Oncology

## 2016-11-19 DIAGNOSIS — Z510 Encounter for antineoplastic radiation therapy: Secondary | ICD-10-CM | POA: Diagnosis not present

## 2016-11-19 DIAGNOSIS — Z7982 Long term (current) use of aspirin: Secondary | ICD-10-CM | POA: Diagnosis not present

## 2016-11-19 DIAGNOSIS — Z79899 Other long term (current) drug therapy: Secondary | ICD-10-CM | POA: Diagnosis not present

## 2016-11-19 DIAGNOSIS — C61 Malignant neoplasm of prostate: Secondary | ICD-10-CM | POA: Diagnosis not present

## 2016-11-20 ENCOUNTER — Ambulatory Visit
Admission: RE | Admit: 2016-11-20 | Discharge: 2016-11-20 | Disposition: A | Discharge status: Home / Self Care | Payer: Medicare Other | Source: Ambulatory Visit | Attending: Radiation Oncology | Admitting: Radiation Oncology

## 2016-11-20 DIAGNOSIS — C61 Malignant neoplasm of prostate: Secondary | ICD-10-CM | POA: Diagnosis not present

## 2016-11-20 DIAGNOSIS — Z79899 Other long term (current) drug therapy: Secondary | ICD-10-CM | POA: Diagnosis not present

## 2016-11-20 DIAGNOSIS — Z7982 Long term (current) use of aspirin: Secondary | ICD-10-CM | POA: Diagnosis not present

## 2016-11-20 DIAGNOSIS — Z510 Encounter for antineoplastic radiation therapy: Secondary | ICD-10-CM | POA: Diagnosis not present

## 2016-11-24 ENCOUNTER — Ambulatory Visit
Admission: RE | Admit: 2016-11-24 | Discharge: 2016-11-24 | Disposition: A | Discharge status: Home / Self Care | Payer: Medicare Other | Source: Ambulatory Visit | Attending: Radiation Oncology | Admitting: Radiation Oncology

## 2016-11-24 DIAGNOSIS — Z510 Encounter for antineoplastic radiation therapy: Secondary | ICD-10-CM | POA: Diagnosis not present

## 2016-11-24 DIAGNOSIS — C61 Malignant neoplasm of prostate: Secondary | ICD-10-CM | POA: Diagnosis not present

## 2016-11-24 DIAGNOSIS — Z79899 Other long term (current) drug therapy: Secondary | ICD-10-CM | POA: Diagnosis not present

## 2016-11-24 DIAGNOSIS — Z7982 Long term (current) use of aspirin: Secondary | ICD-10-CM | POA: Diagnosis not present

## 2016-11-25 ENCOUNTER — Ambulatory Visit
Admission: RE | Admit: 2016-11-25 | Discharge: 2016-11-25 | Disposition: A | Discharge status: Home / Self Care | Payer: Medicare Other | Source: Ambulatory Visit | Attending: Radiation Oncology | Admitting: Radiation Oncology

## 2016-11-25 DIAGNOSIS — Z79899 Other long term (current) drug therapy: Secondary | ICD-10-CM | POA: Diagnosis not present

## 2016-11-25 DIAGNOSIS — C61 Malignant neoplasm of prostate: Secondary | ICD-10-CM | POA: Diagnosis not present

## 2016-11-25 DIAGNOSIS — Z7982 Long term (current) use of aspirin: Secondary | ICD-10-CM | POA: Diagnosis not present

## 2016-11-25 DIAGNOSIS — Z510 Encounter for antineoplastic radiation therapy: Secondary | ICD-10-CM | POA: Diagnosis not present

## 2016-11-26 ENCOUNTER — Ambulatory Visit
Admission: RE | Admit: 2016-11-26 | Discharge: 2016-11-26 | Disposition: A | Discharge status: Home / Self Care | Payer: Medicare Other | Source: Ambulatory Visit | Attending: Radiation Oncology | Admitting: Radiation Oncology

## 2016-11-26 DIAGNOSIS — Z79899 Other long term (current) drug therapy: Secondary | ICD-10-CM | POA: Diagnosis not present

## 2016-11-26 DIAGNOSIS — Z510 Encounter for antineoplastic radiation therapy: Secondary | ICD-10-CM | POA: Diagnosis not present

## 2016-11-26 DIAGNOSIS — C61 Malignant neoplasm of prostate: Secondary | ICD-10-CM | POA: Diagnosis not present

## 2016-11-26 DIAGNOSIS — Z7982 Long term (current) use of aspirin: Secondary | ICD-10-CM | POA: Diagnosis not present

## 2016-11-27 ENCOUNTER — Ambulatory Visit
Admission: RE | Admit: 2016-11-27 | Discharge: 2016-11-27 | Disposition: A | Discharge status: Home / Self Care | Payer: Medicare Other | Source: Ambulatory Visit | Attending: Radiation Oncology | Admitting: Radiation Oncology

## 2016-11-27 DIAGNOSIS — Z510 Encounter for antineoplastic radiation therapy: Secondary | ICD-10-CM | POA: Diagnosis not present

## 2016-11-27 DIAGNOSIS — Z79899 Other long term (current) drug therapy: Secondary | ICD-10-CM | POA: Diagnosis not present

## 2016-11-27 DIAGNOSIS — Z7982 Long term (current) use of aspirin: Secondary | ICD-10-CM | POA: Diagnosis not present

## 2016-11-27 DIAGNOSIS — C61 Malignant neoplasm of prostate: Secondary | ICD-10-CM | POA: Diagnosis not present

## 2016-11-28 ENCOUNTER — Ambulatory Visit: Payer: Medicare Other

## 2016-11-30 ENCOUNTER — Ambulatory Visit
Admission: RE | Admit: 2016-11-30 | Discharge: 2016-11-30 | Disposition: A | Discharge status: Home / Self Care | Payer: Medicare Other | Source: Ambulatory Visit | Attending: Radiation Oncology | Admitting: Radiation Oncology

## 2016-11-30 DIAGNOSIS — C61 Malignant neoplasm of prostate: Secondary | ICD-10-CM | POA: Diagnosis not present

## 2016-11-30 DIAGNOSIS — Z7982 Long term (current) use of aspirin: Secondary | ICD-10-CM | POA: Diagnosis not present

## 2016-11-30 DIAGNOSIS — Z79899 Other long term (current) drug therapy: Secondary | ICD-10-CM | POA: Diagnosis not present

## 2016-11-30 DIAGNOSIS — Z510 Encounter for antineoplastic radiation therapy: Secondary | ICD-10-CM | POA: Diagnosis not present

## 2016-12-01 ENCOUNTER — Ambulatory Visit
Admission: RE | Admit: 2016-12-01 | Discharge: 2016-12-01 | Disposition: A | Discharge status: Home / Self Care | Payer: Medicare Other | Source: Ambulatory Visit | Attending: Radiation Oncology | Admitting: Radiation Oncology

## 2016-12-01 DIAGNOSIS — Z7982 Long term (current) use of aspirin: Secondary | ICD-10-CM | POA: Diagnosis not present

## 2016-12-01 DIAGNOSIS — C61 Malignant neoplasm of prostate: Secondary | ICD-10-CM | POA: Diagnosis not present

## 2016-12-01 DIAGNOSIS — Z510 Encounter for antineoplastic radiation therapy: Secondary | ICD-10-CM | POA: Diagnosis not present

## 2016-12-01 DIAGNOSIS — Z79899 Other long term (current) drug therapy: Secondary | ICD-10-CM | POA: Diagnosis not present

## 2016-12-02 ENCOUNTER — Ambulatory Visit
Admission: RE | Admit: 2016-12-02 | Discharge: 2016-12-02 | Disposition: A | Discharge status: Home / Self Care | Payer: Medicare Other | Source: Ambulatory Visit | Attending: Radiation Oncology | Admitting: Radiation Oncology

## 2016-12-02 DIAGNOSIS — Z79899 Other long term (current) drug therapy: Secondary | ICD-10-CM | POA: Diagnosis not present

## 2016-12-02 DIAGNOSIS — C61 Malignant neoplasm of prostate: Secondary | ICD-10-CM | POA: Diagnosis not present

## 2016-12-02 DIAGNOSIS — Z510 Encounter for antineoplastic radiation therapy: Secondary | ICD-10-CM | POA: Diagnosis not present

## 2016-12-02 DIAGNOSIS — Z7982 Long term (current) use of aspirin: Secondary | ICD-10-CM | POA: Diagnosis not present

## 2016-12-03 ENCOUNTER — Ambulatory Visit
Admission: RE | Admit: 2016-12-03 | Discharge: 2016-12-03 | Disposition: A | Discharge status: Home / Self Care | Payer: Medicare Other | Source: Ambulatory Visit | Attending: Radiation Oncology | Admitting: Radiation Oncology

## 2016-12-03 DIAGNOSIS — Z79899 Other long term (current) drug therapy: Secondary | ICD-10-CM | POA: Diagnosis not present

## 2016-12-03 DIAGNOSIS — C61 Malignant neoplasm of prostate: Secondary | ICD-10-CM | POA: Diagnosis not present

## 2016-12-03 DIAGNOSIS — Z7982 Long term (current) use of aspirin: Secondary | ICD-10-CM | POA: Diagnosis not present

## 2016-12-03 DIAGNOSIS — Z510 Encounter for antineoplastic radiation therapy: Secondary | ICD-10-CM | POA: Diagnosis not present

## 2016-12-04 ENCOUNTER — Ambulatory Visit
Admission: RE | Admit: 2016-12-04 | Discharge: 2016-12-04 | Disposition: A | Discharge status: Home / Self Care | Payer: Medicare Other | Source: Ambulatory Visit | Attending: Radiation Oncology | Admitting: Radiation Oncology

## 2016-12-04 DIAGNOSIS — Z7982 Long term (current) use of aspirin: Secondary | ICD-10-CM | POA: Diagnosis not present

## 2016-12-04 DIAGNOSIS — Z79899 Other long term (current) drug therapy: Secondary | ICD-10-CM | POA: Diagnosis not present

## 2016-12-04 DIAGNOSIS — C61 Malignant neoplasm of prostate: Secondary | ICD-10-CM | POA: Diagnosis not present

## 2016-12-04 DIAGNOSIS — Z510 Encounter for antineoplastic radiation therapy: Secondary | ICD-10-CM | POA: Diagnosis not present

## 2016-12-07 ENCOUNTER — Ambulatory Visit
Admission: RE | Admit: 2016-12-07 | Discharge: 2016-12-07 | Disposition: A | Discharge status: Home / Self Care | Payer: Medicare Other | Source: Ambulatory Visit | Attending: Radiation Oncology | Admitting: Radiation Oncology

## 2016-12-07 ENCOUNTER — Ambulatory Visit: Payer: Medicare Other

## 2016-12-07 DIAGNOSIS — Z79899 Other long term (current) drug therapy: Secondary | ICD-10-CM | POA: Diagnosis not present

## 2016-12-07 DIAGNOSIS — Z7982 Long term (current) use of aspirin: Secondary | ICD-10-CM | POA: Diagnosis not present

## 2016-12-07 DIAGNOSIS — C61 Malignant neoplasm of prostate: Secondary | ICD-10-CM | POA: Diagnosis not present

## 2016-12-07 DIAGNOSIS — Z510 Encounter for antineoplastic radiation therapy: Secondary | ICD-10-CM | POA: Diagnosis not present

## 2016-12-08 ENCOUNTER — Ambulatory Visit
Admission: RE | Admit: 2016-12-08 | Discharge: 2016-12-08 | Disposition: A | Discharge status: Home / Self Care | Payer: Medicare Other | Source: Ambulatory Visit | Attending: Radiation Oncology | Admitting: Radiation Oncology

## 2016-12-08 ENCOUNTER — Ambulatory Visit: Payer: Medicare Other

## 2016-12-08 DIAGNOSIS — C61 Malignant neoplasm of prostate: Secondary | ICD-10-CM | POA: Diagnosis not present

## 2016-12-08 DIAGNOSIS — Z7982 Long term (current) use of aspirin: Secondary | ICD-10-CM | POA: Diagnosis not present

## 2016-12-08 DIAGNOSIS — Z79899 Other long term (current) drug therapy: Secondary | ICD-10-CM | POA: Diagnosis not present

## 2016-12-08 DIAGNOSIS — Z510 Encounter for antineoplastic radiation therapy: Secondary | ICD-10-CM | POA: Diagnosis not present

## 2016-12-09 ENCOUNTER — Ambulatory Visit
Admission: RE | Admit: 2016-12-09 | Discharge: 2016-12-09 | Disposition: A | Discharge status: Home / Self Care | Payer: Medicare Other | Source: Ambulatory Visit | Attending: Radiation Oncology | Admitting: Radiation Oncology

## 2016-12-09 DIAGNOSIS — Z510 Encounter for antineoplastic radiation therapy: Secondary | ICD-10-CM | POA: Diagnosis not present

## 2016-12-09 DIAGNOSIS — C61 Malignant neoplasm of prostate: Secondary | ICD-10-CM | POA: Diagnosis not present

## 2016-12-09 DIAGNOSIS — Z79899 Other long term (current) drug therapy: Secondary | ICD-10-CM | POA: Diagnosis not present

## 2016-12-09 DIAGNOSIS — Z7982 Long term (current) use of aspirin: Secondary | ICD-10-CM | POA: Diagnosis not present

## 2016-12-10 ENCOUNTER — Ambulatory Visit
Admission: RE | Admit: 2016-12-10 | Discharge: 2016-12-10 | Disposition: A | Discharge status: Home / Self Care | Payer: Medicare Other | Source: Ambulatory Visit | Attending: Radiation Oncology | Admitting: Radiation Oncology

## 2016-12-10 DIAGNOSIS — C61 Malignant neoplasm of prostate: Secondary | ICD-10-CM | POA: Diagnosis not present

## 2016-12-10 DIAGNOSIS — Z510 Encounter for antineoplastic radiation therapy: Secondary | ICD-10-CM | POA: Diagnosis not present

## 2016-12-10 DIAGNOSIS — Z79899 Other long term (current) drug therapy: Secondary | ICD-10-CM | POA: Diagnosis not present

## 2016-12-10 DIAGNOSIS — Z7982 Long term (current) use of aspirin: Secondary | ICD-10-CM | POA: Diagnosis not present

## 2016-12-11 ENCOUNTER — Ambulatory Visit
Admission: RE | Admit: 2016-12-11 | Discharge: 2016-12-11 | Disposition: A | Discharge status: Home / Self Care | Payer: Medicare Other | Source: Ambulatory Visit | Attending: Radiation Oncology | Admitting: Radiation Oncology

## 2016-12-11 DIAGNOSIS — Z7982 Long term (current) use of aspirin: Secondary | ICD-10-CM | POA: Diagnosis not present

## 2016-12-11 DIAGNOSIS — Z510 Encounter for antineoplastic radiation therapy: Secondary | ICD-10-CM | POA: Diagnosis not present

## 2016-12-11 DIAGNOSIS — C61 Malignant neoplasm of prostate: Secondary | ICD-10-CM | POA: Diagnosis not present

## 2016-12-11 DIAGNOSIS — Z79899 Other long term (current) drug therapy: Secondary | ICD-10-CM | POA: Diagnosis not present

## 2016-12-14 ENCOUNTER — Ambulatory Visit
Admission: RE | Admit: 2016-12-14 | Discharge: 2016-12-14 | Disposition: A | Discharge status: Home / Self Care | Payer: Medicare Other | Source: Ambulatory Visit | Attending: Radiation Oncology | Admitting: Radiation Oncology

## 2016-12-14 DIAGNOSIS — Z79899 Other long term (current) drug therapy: Secondary | ICD-10-CM | POA: Diagnosis not present

## 2016-12-14 DIAGNOSIS — C61 Malignant neoplasm of prostate: Secondary | ICD-10-CM | POA: Diagnosis not present

## 2016-12-14 DIAGNOSIS — Z7982 Long term (current) use of aspirin: Secondary | ICD-10-CM | POA: Diagnosis not present

## 2016-12-14 DIAGNOSIS — Z510 Encounter for antineoplastic radiation therapy: Secondary | ICD-10-CM | POA: Diagnosis not present

## 2016-12-15 ENCOUNTER — Ambulatory Visit
Admission: RE | Admit: 2016-12-15 | Discharge: 2016-12-15 | Disposition: A | Discharge status: Home / Self Care | Payer: Medicare Other | Source: Ambulatory Visit | Attending: Radiation Oncology | Admitting: Radiation Oncology

## 2016-12-15 DIAGNOSIS — Z7982 Long term (current) use of aspirin: Secondary | ICD-10-CM | POA: Diagnosis not present

## 2016-12-15 DIAGNOSIS — Z79899 Other long term (current) drug therapy: Secondary | ICD-10-CM | POA: Diagnosis not present

## 2016-12-15 DIAGNOSIS — C61 Malignant neoplasm of prostate: Secondary | ICD-10-CM | POA: Diagnosis not present

## 2016-12-15 DIAGNOSIS — Z510 Encounter for antineoplastic radiation therapy: Secondary | ICD-10-CM | POA: Diagnosis not present

## 2016-12-17 ENCOUNTER — Encounter: Payer: Self-pay | Admitting: Radiation Oncology

## 2016-12-17 NOTE — Progress Notes (Signed)
°  Radiation Oncology         [phone] ________________________________  Name: Jon Summers MRN: [id number]  Date: 12/17/2016  DOB: [birth year]  End of Treatment Note  Diagnosis: 71 y.o. gentleman with stage T1c adenocarcinoma of the prostate with a Gleason Score of 3+4 and a PSA of 6.71   Indication for treatment:  Curative       Radiation treatment dates: 10/20/16-12/15/16  Site/dose:  Prostate/ 78 Gy in 40 fractions at a rate of 1.95 Gy per fraction  Beams/energy:  IMRT/ 6X  Narrative: The patient tolerated radiation treatment relatively well. Patient reported mild dysuria, weak stream and nocturia x2/night during treatment. He denied urinary leakage, incontinence, urgency, or hesitancy.  Plan: The patient has completed radiation treatment. The patient will return to radiation oncology clinic for routine followup in one month. I advised him to call or return sooner if he has any questions or concerns related to his recovery or treatment. ________________________________  Sheral Apley. Tammi Klippel, M.D.   This document serves as a record of services personally performed by Tyler Pita, MD. It was created on his behalf by Bethann Humble, a trained medical scribe. The creation of this record is based on the scribe's personal observations and the provider's statements to them. This document has been checked and approved by the attending provider.

## 2017-01-14 ENCOUNTER — Encounter: Payer: Self-pay | Admitting: Urology

## 2017-01-14 ENCOUNTER — Ambulatory Visit
Admission: RE | Admit: 2017-01-14 | Discharge: 2017-01-14 | Disposition: A | Discharge status: Home / Self Care | Payer: Medicare Other | Source: Ambulatory Visit | Attending: Urology | Admitting: Urology

## 2017-01-14 VITALS — BP 136/78 | HR 48 | Temp 97.4°F | Resp 20 | Ht 71.0 in | Wt 204.2 lb

## 2017-01-14 DIAGNOSIS — C61 Malignant neoplasm of prostate: Secondary | ICD-10-CM

## 2017-01-14 DIAGNOSIS — Z510 Encounter for antineoplastic radiation therapy: Secondary | ICD-10-CM | POA: Diagnosis not present

## 2017-01-14 DIAGNOSIS — Z7982 Long term (current) use of aspirin: Secondary | ICD-10-CM | POA: Diagnosis not present

## 2017-01-14 DIAGNOSIS — Z79899 Other long term (current) drug therapy: Secondary | ICD-10-CM | POA: Diagnosis not present

## 2017-01-14 NOTE — Progress Notes (Signed)
  Radiation Oncology         [phone] ________________________________  Name: ATTICUS WEDIN MRN: [id number]  Date: 01/14/2017  DOB: 1945-07-11  Post Treatment Note  CC: Dorothyann Peng, NP  Ardis Hughs, MD  Diagnosis:   71 y.o. gentleman with stageT1c adenocarcinoma of the prostate with a Gleason Score of 3+4 and a PSA of 6.71   Interval Since Last Radiation:  4 weeks  10/20/16-12/15/16:  Prostate/ 69 Gy in 40 fractions at a rate of 1.95 Gy per fraction  Narrative:  The patient returns today for routine follow-up.  He tolerated radiation treatment relatively well. He reported mild dysuria, weak stream and nocturia x2/night during treatment but denied urinary leakage, incontinence, urgency, or hesitancy.                              On review of systems, the patient states that he is doing very well overall. He reports that his lower urinary tract symptoms are back to baseline. Specifically denies dysuria, gross hematuria, increased in Frequency, urgency, incomplete bladder emptying or incontinence. He reports nocturia 1 per night. His current IPSS is 6 whereas Pre-treatment IPSS was 4. He denies abdominal pain, nausea, vomiting, diarrhea or constipation. He reports a healthy appetite and is maintaining his weight.  He reports a normal energy level and has been able to continue to participate in his routine daily activities, uninterrupted.  ALLERGIES:  has No Known Allergies.  Meds: Current Outpatient Prescriptions  Medication Sig Dispense Refill  . aspirin 81 MG tablet Take 81 mg by mouth daily.    . benazepril-hydrochlorthiazide (LOTENSIN HCT) 20-25 MG tablet Take 1 tablet by mouth daily. 90 tablet 3   No current facility-administered medications for this encounter.     Physical Findings:  height is 5\' 11"  (1.803 m) and weight is 204 lb 3.2 oz (92.6 kg). His oral temperature is 97.4 F (36.3 C) (abnormal). His blood pressure is 136/78 and his pulse is 48 (abnormal).  His respiration is 20 and oxygen saturation is 100%.  Pain Assessment Pain Score: 0-No pain/10 In general this is a well appearing Caucasian male in no acute distress. He's alert and oriented x4 and appropriate throughout the examination. Cardiopulmonary assessment is negative for acute distress and he exhibits normal effort.   Lab Findings: Lab Results  Component Value Date   WBC 6.1 04/17/2016   HGB 12.6 (L) 04/17/2016   HCT 37.1 (L) 04/17/2016   MCV 88.3 04/17/2016   PLT 226.0 04/17/2016     Radiographic Findings: No results found.  Impression/Plan: 1. 71 y.o. gentleman with stageT1c adenocarcinoma of the prostate with a Gleason Score of 3+4 and a PSA of 6.71.  He will continue to follow up with urology for ongoing PSA determinations with Dr. Louis Meckel. He understands what to expect with regards to PSA monitoring going forward. I will look forward to following his response to treatment via correspondence with urology, and would be happy to continue to participate in his care if clinically indicated. I talked to the patient about what to expect in the future, including his risk for erectile dysfunction and rectal bleeding. I encouraged him to call or return to the office if he has any questions regarding his previous radiation or possible radiation side effects. He was comfortable with this plan and will follow up as needed.    Nicholos Johns, PA-C

## 2017-01-14 NOTE — Addendum Note (Signed)
Encounter addended by: Malena Edman, RN on: 01/14/2017 10:36 AM<BR>    Actions taken: Charge Capture section accepted

## 2017-01-26 LAB — PSA: PSA: 3.1

## 2017-03-04 ENCOUNTER — Encounter: Payer: Self-pay | Admitting: Family Medicine

## 2017-04-21 DIAGNOSIS — H2513 Age-related nuclear cataract, bilateral: Secondary | ICD-10-CM | POA: Diagnosis not present

## 2017-04-21 DIAGNOSIS — H18413 Arcus senilis, bilateral: Secondary | ICD-10-CM | POA: Diagnosis not present

## 2017-04-21 DIAGNOSIS — H538 Other visual disturbances: Secondary | ICD-10-CM | POA: Diagnosis not present

## 2017-04-21 DIAGNOSIS — H5212 Myopia, left eye: Secondary | ICD-10-CM | POA: Diagnosis not present

## 2017-05-10 ENCOUNTER — Other Ambulatory Visit: Payer: Self-pay | Admitting: Adult Health

## 2017-05-10 DIAGNOSIS — I10 Essential (primary) hypertension: Secondary | ICD-10-CM

## 2017-05-11 NOTE — Telephone Encounter (Signed)
Sent to the pharmacy by e-scribe.  Pt now scheduled for cpx on 06/22/17 @ 7:30.

## 2017-06-22 ENCOUNTER — Ambulatory Visit (INDEPENDENT_AMBULATORY_CARE_PROVIDER_SITE_OTHER): Payer: Medicare Other | Admitting: Adult Health

## 2017-06-22 ENCOUNTER — Encounter: Payer: Self-pay | Admitting: Adult Health

## 2017-06-22 VITALS — BP 140/74 | Temp 98.2°F | Ht 70.5 in | Wt 212.0 lb

## 2017-06-22 DIAGNOSIS — E785 Hyperlipidemia, unspecified: Secondary | ICD-10-CM

## 2017-06-22 DIAGNOSIS — Z0000 Encounter for general adult medical examination without abnormal findings: Secondary | ICD-10-CM

## 2017-06-22 DIAGNOSIS — Z23 Encounter for immunization: Secondary | ICD-10-CM | POA: Diagnosis not present

## 2017-06-22 DIAGNOSIS — Z1159 Encounter for screening for other viral diseases: Secondary | ICD-10-CM | POA: Diagnosis not present

## 2017-06-22 DIAGNOSIS — Z1211 Encounter for screening for malignant neoplasm of colon: Secondary | ICD-10-CM

## 2017-06-22 DIAGNOSIS — I10 Essential (primary) hypertension: Secondary | ICD-10-CM | POA: Diagnosis not present

## 2017-06-22 LAB — CBC WITH DIFFERENTIAL/PLATELET
BASOS PCT: 0.5 % (ref 0.0–3.0)
Basophils Absolute: 0 10*3/uL (ref 0.0–0.1)
EOS PCT: 3.4 % (ref 0.0–5.0)
Eosinophils Absolute: 0.2 10*3/uL (ref 0.0–0.7)
HEMATOCRIT: 40.1 % (ref 39.0–52.0)
Hemoglobin: 13.8 g/dL (ref 13.0–17.0)
LYMPHS ABS: 1.2 10*3/uL (ref 0.7–4.0)
LYMPHS PCT: 20.9 % (ref 12.0–46.0)
MCHC: 34.3 g/dL (ref 30.0–36.0)
MCV: 94.4 fl (ref 78.0–100.0)
MONOS PCT: 6.4 % (ref 3.0–12.0)
Monocytes Absolute: 0.4 10*3/uL (ref 0.1–1.0)
NEUTROS ABS: 3.8 10*3/uL (ref 1.4–7.7)
NEUTROS PCT: 68.8 % (ref 43.0–77.0)
PLATELETS: 232 10*3/uL (ref 150.0–400.0)
RBC: 4.24 Mil/uL (ref 4.22–5.81)
RDW: 13.6 % (ref 11.5–15.5)
WBC: 5.6 10*3/uL (ref 4.0–10.5)

## 2017-06-22 LAB — LIPID PANEL
CHOLESTEROL: 144 mg/dL (ref 0–200)
HDL: 45.6 mg/dL (ref >39.00)
LDL Cholesterol: 87 mg/dL (ref 0–99)
NONHDL: 98.59
Total CHOL/HDL Ratio: 3
Triglycerides: 56 mg/dL (ref 0.0–149.0)
VLDL: 11.2 mg/dL (ref 0.0–40.0)

## 2017-06-22 LAB — HEPATIC FUNCTION PANEL
ALBUMIN: 4.1 g/dL (ref 3.5–5.2)
ALT: 12 U/L (ref 0–53)
AST: 12 U/L (ref 0–37)
Alkaline Phosphatase: 66 U/L (ref 39–117)
Bilirubin, Direct: 0.3 mg/dL (ref 0.0–0.3)
TOTAL PROTEIN: 6.6 g/dL (ref 6.0–8.3)
Total Bilirubin: 1.5 mg/dL — ABNORMAL HIGH (ref 0.2–1.2)

## 2017-06-22 LAB — BASIC METABOLIC PANEL
BUN: 20 mg/dL (ref 6–23)
CALCIUM: 8.6 mg/dL (ref 8.4–10.5)
CHLORIDE: 103 meq/L (ref 96–112)
CO2: 31 mEq/L (ref 19–32)
Creatinine, Ser: 0.87 mg/dL (ref 0.40–1.50)
GFR: 92.35 mL/min (ref >60.00)
GLUCOSE: 93 mg/dL (ref 70–99)
POTASSIUM: 4 meq/L (ref 3.5–5.1)
SODIUM: 139 meq/L (ref 135–145)

## 2017-06-22 LAB — TSH: TSH: 1.79 u[IU]/mL (ref 0.35–4.50)

## 2017-06-22 NOTE — Progress Notes (Signed)
Subjective:    Patient ID: Jon Summers, male    DOB: [birth year], 72 y.o.   MRN: [id number]  HPI  Patient presents for yearly preventative medicine examination. 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  Essential Hypertension - Controlled with Lotensin-HCT BP Readings from Last 3 Encounters:  06/22/17 140/74  01/14/17 136/78  08/05/16 129/76   Prostate cancer-over the last year he is been treated for prostate cancer.  He went through radiation treatment from 10/20/2016 to 12/15/2016.  He reports that he tolerated duration well.  He reports currently that he is doing very well.  Also reports that his lower urinary tract symptoms are back to baseline.  Denies any dysuria, gross hematuria, increase in frequency, urgency, or incomplete bladder emptying.  He does get up to use the restroom about 1 time per night.  He denies any nausea, vomiting, diarrhea, or constipation. He is following up with Urology next month.   All immunizations and health maintenance protocols were reviewed with the patient and needed orders were placed.  He is due for his first pneumonia vaccination  Appropriate screening laboratory values were ordered for the patient including screening of hyperlipidemia, renal function and hepatic function.   Medication reconciliation,  past medical history, social history, problem list and allergies were reviewed in detail with the patient  Goals were established with regard to weight loss, exercise, and  diet in compliance with medications.  Eat he eats a heart healthy diet and exercises (Pilates) on a regular basis he has been able to maintain a healthy weight Wt Readings from Last 3 Encounters:  06/22/17 212 lb (96.2 kg)  01/14/17 204 lb 3.2 oz (92.6 kg)  08/05/16  210 lb 9.6 oz (95.5 kg)   End of life planning was discussed.  He has an advanced directive and living will  He is not up-to-date on his colonoscopy. He does routine dental and vision screens.   He has no acute complaints.   Review of Systems  Constitutional: Negative.   HENT: Negative.   Eyes: Negative.   Respiratory: Negative.   Cardiovascular: Negative.   Gastrointestinal: Negative.   Endocrine: Negative.   Genitourinary: Negative.   Musculoskeletal: Negative.   Skin: Negative.   Allergic/Immunologic: Negative.   Neurological: Negative.   Hematological: Negative.   Psychiatric/Behavioral: Negative.   All other systems reviewed and are negative.  Past Medical History:  Diagnosis Date  . Allergy   . CAD (coronary artery disease)    From medical records in [place]  . Chicken pox   . Chronic kidney disease    From medical records in [place]  . DJD (degenerative joint disease)   . Hyperlipidemia   . Hypertension   . Prostate cancer ([place])   . 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 Marital status: Married    Spouse name: Not on file  . Number of children: Not on file  . Years of education: Not on file  . Highest education level: Not on file  Occupational History  . Not on file  Social Needs  . Financial resource strain: Not on file  . Food insecurity:    Worry: Not on file    Inability: Not on file  .  Transportation needs:    Medical: Not on file    Non-medical: Not on file  Tobacco Use  . Smoking status: Former Smoker    Packs/day: 1.00    Years: 25.00    Pack years: 25.00    Types: Cigarettes    Last attempt to quit: 03/24/1987    Years since quitting: 30.2  . Smokeless tobacco: Never Used  Substance and Sexual Activity  . Alcohol use: Yes    Alcohol/week: 0.0 oz    Comment: Socially; one drink per week.  . Drug use: No  . Sexual activity: Yes  Lifestyle  . Physical activity:    Days per  week: Not on file    Minutes per session: Not on file  . Stress: Not on file  Relationships  . Social connections:    Talks on phone: Not on file    Gets together: Not on file    Attends religious service: Not on file    Active member of club or organization: Not on file    Attends meetings of clubs or organizations: Not on file    Relationship status: Not on file  . Intimate partner violence:    Fear of current or ex partner: Not on file    Emotionally abused: Not on file    Physically abused: Not on file    Forced sexual activity: Not on file  Other Topics Concern  . 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    Married   No biological children    Past Surgical History:  Procedure Laterality Date  . HERNIA REPAIR  1994    Family History  Problem Relation Age of Onset  . Dementia Mother   . Miscarriages / [place] Mother   . Stroke Father        From dental visit  . Prostate cancer Father   . Cancer Father 73       prostate, treated with radiation  . Lung cancer Paternal Uncle   . Cancer Paternal Uncle        unknown/heavy smoker    No Known Allergies  Current Outpatient Medications on File Prior to Visit  Medication Sig Dispense Refill  . aspirin 81 MG tablet Take 81 mg by mouth daily.    . benazepril-hydrochlorthiazide (LOTENSIN HCT) 20-25 MG tablet TAKE ONE TABLET BY MOUTH DAILY 60 tablet 0   No current facility-administered medications on file prior to visit.     BP 140/74   Temp 98.2 F (36.8 C) (Oral)   Ht 5' 10.5" (1.791 m) Comment: Without Shoes  Wt 212 lb (96.2 kg)   BMI 29.99 kg/m       Objective:   Physical Exam  Constitutional: He is oriented to person, place, and time. He appears well-developed and well-nourished. No distress.  HENT:  Head: Normocephalic and atraumatic.  Right Ear: External ear normal.  Left Ear: External ear normal.  Nose: Nose normal.    Mouth/Throat: Oropharynx is clear and moist. No oropharyngeal exudate.  Eyes: Pupils are equal, round, and reactive to light. Conjunctivae and EOM are normal. Right eye exhibits no discharge. Left eye exhibits no discharge. No scleral icterus.  Neck: Normal range of motion. Neck supple. No JVD present. No tracheal deviation present. No thyromegaly present.  Cardiovascular: Normal rate, regular rhythm, normal heart sounds and intact distal pulses. Exam reveals no gallop and no friction rub.  No  murmur heard. Pulmonary/Chest: Effort normal and breath sounds normal. No stridor. No respiratory distress. He has no wheezes. He has no rales. He exhibits no tenderness.  Abdominal: Soft. Bowel sounds are normal. He exhibits no distension and no mass. There is no tenderness. There is no rebound and no guarding.  Genitourinary:  Genitourinary Comments: Defer to Urology   Musculoskeletal: Normal range of motion. He exhibits no edema, tenderness or deformity.  Lymphadenopathy:    He has no cervical adenopathy.  Neurological: He is alert and oriented to person, place, and time. He has normal reflexes. He displays normal reflexes. No cranial nerve deficit. He exhibits normal muscle tone. Coordination normal.  Skin: Skin is warm and dry. No rash noted. He is not diaphoretic. No erythema. No pallor.  Psychiatric: He has a normal mood and affect. His behavior is normal. Judgment and thought content normal.  Nursing note and vitals reviewed.     Assessment & Plan:  1. Routine general medical examination at a health care facility - Continue with diet and exercise  - Follow up in one year or sooner if needed - Basic metabolic panel - CBC with Differential/Platelet - Hepatic function panel - Lipid panel - TSH  2. Essential hypertension - Did not take medication this morning.  - Basic metabolic panel - CBC with Differential/Platelet - Hepatic function panel - Lipid panel - TSH  3. Hyperlipidemia,  unspecified hyperlipidemia type - Consider statin - has been diet controlled in the past.  - Basic metabolic panel - CBC with Differential/Platelet - Hepatic function panel - Lipid panel - TSH  4. Need for hepatitis C screening test - Hep C Antibody  5. Colon cancer screening  - Ambulatory referral to Gastroenterology  6. Need for Streptococcus pneumoniae vaccination  - Pneumococcal conjugate vaccine 13-valent   Dorothyann Peng, NP

## 2017-06-22 NOTE — Patient Instructions (Signed)
It was great seeing you this morning.   I will follow up with you regarding your blood work   Someone will contact you to get you scheduled for your colonoscopy   Continue to work on diet and exercise   Follow up in one year or sooner if needed

## 2017-06-23 LAB — HEPATITIS C ANTIBODY
Hepatitis C Ab: NONREACTIVE
SIGNAL TO CUT-OFF: 0.02 (ref <1.00)

## 2017-07-14 ENCOUNTER — Other Ambulatory Visit: Payer: Self-pay | Admitting: Adult Health

## 2017-07-14 DIAGNOSIS — I10 Essential (primary) hypertension: Secondary | ICD-10-CM

## 2017-07-14 NOTE — Telephone Encounter (Signed)
Sent to the pharmacy by e-scribe. 

## 2017-07-22 ENCOUNTER — Encounter: Payer: Self-pay | Admitting: Adult Health

## 2017-07-29 LAB — PSA: PSA: 1.06

## 2017-08-10 ENCOUNTER — Encounter: Payer: Self-pay | Admitting: Family Medicine

## 2018-06-01 DIAGNOSIS — H2512 Age-related nuclear cataract, left eye: Secondary | ICD-10-CM | POA: Diagnosis not present

## 2018-06-30 ENCOUNTER — Encounter: Payer: Medicare Other | Admitting: Adult Health

## 2018-07-06 ENCOUNTER — Encounter: Payer: Medicare Other | Admitting: Adult Health

## 2018-07-11 ENCOUNTER — Other Ambulatory Visit: Payer: Self-pay | Admitting: Adult Health

## 2018-07-11 DIAGNOSIS — I10 Essential (primary) hypertension: Secondary | ICD-10-CM

## 2018-07-12 NOTE — Telephone Encounter (Signed)
Sent to the pharmacy by e-scribe for 90 days. 

## 2018-10-06 ENCOUNTER — Other Ambulatory Visit: Payer: Self-pay | Admitting: Adult Health

## 2018-10-06 DIAGNOSIS — I10 Essential (primary) hypertension: Secondary | ICD-10-CM

## 2018-10-07 ENCOUNTER — Encounter: Payer: Self-pay | Admitting: Family Medicine

## 2018-10-07 NOTE — Telephone Encounter (Signed)
90 days sent to the pharmacy by e-scribe.  Letter mailed to the pt.

## 2018-10-09 ENCOUNTER — Other Ambulatory Visit: Payer: Self-pay | Admitting: Adult Health

## 2018-10-09 DIAGNOSIS — I10 Essential (primary) hypertension: Secondary | ICD-10-CM

## 2018-10-12 NOTE — Telephone Encounter (Signed)
DENIED.  FILLED ON 10/07/2018.  MESSAGE SENT TO THE PHARMACY.

## 2018-11-15 ENCOUNTER — Ambulatory Visit (INDEPENDENT_AMBULATORY_CARE_PROVIDER_SITE_OTHER): Payer: Medicare Other | Admitting: Adult Health

## 2018-11-15 ENCOUNTER — Encounter: Payer: Self-pay | Admitting: Adult Health

## 2018-11-15 ENCOUNTER — Other Ambulatory Visit: Payer: Self-pay | Admitting: Adult Health

## 2018-11-15 ENCOUNTER — Other Ambulatory Visit: Payer: Self-pay

## 2018-11-15 VITALS — BP 122/66 | Temp 97.9°F | Ht 72.0 in | Wt 215.0 lb

## 2018-11-15 DIAGNOSIS — C61 Malignant neoplasm of prostate: Secondary | ICD-10-CM

## 2018-11-15 DIAGNOSIS — Z1211 Encounter for screening for malignant neoplasm of colon: Secondary | ICD-10-CM

## 2018-11-15 DIAGNOSIS — E785 Hyperlipidemia, unspecified: Secondary | ICD-10-CM

## 2018-11-15 DIAGNOSIS — Z0000 Encounter for general adult medical examination without abnormal findings: Secondary | ICD-10-CM

## 2018-11-15 DIAGNOSIS — I10 Essential (primary) hypertension: Secondary | ICD-10-CM | POA: Diagnosis not present

## 2018-11-15 DIAGNOSIS — Z23 Encounter for immunization: Secondary | ICD-10-CM | POA: Diagnosis not present

## 2018-11-15 LAB — COMPREHENSIVE METABOLIC PANEL
ALT: 20 U/L (ref 0–53)
AST: 15 U/L (ref 0–37)
Albumin: 4.7 g/dL (ref 3.5–5.2)
Alkaline Phosphatase: 73 U/L (ref 39–117)
BUN: 23 mg/dL (ref 6–23)
CO2: 30 mEq/L (ref 19–32)
Calcium: 9.3 mg/dL (ref 8.4–10.5)
Chloride: 99 mEq/L (ref 96–112)
Creatinine, Ser: 0.95 mg/dL (ref 0.40–1.50)
GFR: 78.18 mL/min (ref >60.00)
Glucose, Bld: 105 mg/dL — ABNORMAL HIGH (ref 70–99)
Potassium: 4.7 mEq/L (ref 3.5–5.1)
Sodium: 138 mEq/L (ref 135–145)
Total Bilirubin: 1.4 mg/dL — ABNORMAL HIGH (ref 0.2–1.2)
Total Protein: 7 g/dL (ref 6.0–8.3)

## 2018-11-15 LAB — CBC WITH DIFFERENTIAL/PLATELET
Basophils Absolute: 0 10*3/uL (ref 0.0–0.1)
Basophils Relative: 0.7 % (ref 0.0–3.0)
Eosinophils Absolute: 0.2 10*3/uL (ref 0.0–0.7)
Eosinophils Relative: 3.1 % (ref 0.0–5.0)
HCT: 41.8 % (ref 39.0–52.0)
Hemoglobin: 14 g/dL (ref 13.0–17.0)
Lymphocytes Relative: 27.9 % (ref 12.0–46.0)
Lymphs Abs: 1.7 10*3/uL (ref 0.7–4.0)
MCHC: 33.5 g/dL (ref 30.0–36.0)
MCV: 93 fl (ref 78.0–100.0)
Monocytes Absolute: 0.4 10*3/uL (ref 0.1–1.0)
Monocytes Relative: 7.2 % (ref 3.0–12.0)
Neutro Abs: 3.8 10*3/uL (ref 1.4–7.7)
Neutrophils Relative %: 61.1 % (ref 43.0–77.0)
Platelets: 255 10*3/uL (ref 150.0–400.0)
RBC: 4.5 Mil/uL (ref 4.22–5.81)
RDW: 13.9 % (ref 11.5–15.5)
WBC: 6.2 10*3/uL (ref 4.0–10.5)

## 2018-11-15 LAB — LIPID PANEL
Cholesterol: 214 mg/dL — ABNORMAL HIGH (ref 0–200)
HDL: 48.1 mg/dL (ref >39.00)
LDL Cholesterol: 151 mg/dL — ABNORMAL HIGH (ref 0–99)
NonHDL: 166.04
Total CHOL/HDL Ratio: 4
Triglycerides: 75 mg/dL (ref 0.0–149.0)
VLDL: 15 mg/dL (ref 0.0–40.0)

## 2018-11-15 LAB — PSA: PSA: 0.61 ng/mL (ref 0.10–4.00)

## 2018-11-15 LAB — TSH: TSH: 0.98 u[IU]/mL (ref 0.35–4.50)

## 2018-11-15 NOTE — Patient Instructions (Signed)
It was great seeing you   We will follow up with you regarding your blood work   Please work on weight loss through diet and exercise   Follow up in one year or sooner if needed  Someone will call you to schedule your colonoscopy

## 2018-11-15 NOTE — Progress Notes (Signed)
Subjective:    Patient ID: Jon Summers, male    DOB: 11-11-1945, 73 y.o.   MRN: [id number]  HPI Patient presents for yearly preventative medicine examination. 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  Hypertension - Is well controlled on Lotensin-HCT.  He denies chest pain, shortness of breath, dizziness, lightheadedness, headaches, or syncopal episodes. BP Readings from Last 3 Encounters:  11/15/18 122/66  06/22/17 140/74  01/14/17 136/78   H/o Prostate Cancer -was treated for prostate cancer in 2018.  He underwent radiation therapy from 10/20/2016 to 12/15/2016.  He gets up to use the restroom about 1 time per night.  He denies dysuria, hematuria, urinary frequency, or feeling of incomplete bladder emptying.  He is followed by neurology on a regular basis  All immunizations and health maintenance protocols were reviewed with the patient and needed orders were placed.  He is due for yearly influenza vaccination as well as Pneumovax 23  Appropriate screening laboratory values were ordered for the patient including screening of hyperlipidemia, renal function and hepatic function. If indicated by BPH, a PSA was ordered.  Medication reconciliation,  past medical history, social history, problem list and allergies were reviewed in detail with the patient  Goals were established with regard to weight loss, exercise, and  diet in compliance with medications. He has not been following a certain diet and has not been exercising   Wt Readings from Last 3 Encounters:  11/15/18 215 lb (97.5 kg)  06/22/17 212 lb (96.2 kg)  01/14/17 204 lb 3.2 oz (92.6 kg)   End of life planning was discussed.  He has an advanced directive and living will  He is due for routine screening  colonoscopy.  He is up-to-date on routine dental and vision screens.  He has no acute issues   Review of Systems  Constitutional: Negative.   HENT: Negative.   Eyes: Negative.   Respiratory: Negative.   Cardiovascular: Negative.   Gastrointestinal: Negative.   Endocrine: Negative.   Genitourinary: Negative.   Musculoskeletal: Negative.   Skin: Negative.   Allergic/Immunologic: Negative.   Neurological: Negative.   Hematological: Negative.   Psychiatric/Behavioral: Negative.   All other systems reviewed and are negative.  Past Medical History:  Diagnosis Date  . Allergy   . CAD (coronary artery disease)    From medical records in [place]  . Chicken pox   . Chronic kidney disease    From medical records in [place]  . DJD (degenerative joint disease)   . Hyperlipidemia   . Hypertension   . Prostate cancer ([place])   . 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 Marital status: Married    Spouse name: Not on file  . Number of children: Not on file  . Years of education: Not on file  . Highest education level: Not on file  Occupational History  . Not on file  Social Needs  . Financial resource strain: Not on file  . Food insecurity    Worry: Not on file    Inability: Not on file  . Transportation needs    Medical: Not on file    Non-medical: Not on file  Tobacco Use  . Smoking status: Former Smoker  Packs/day: 1.00    Years: 25.00    Pack years: 25.00    Types: Cigarettes    Quit date: 03/24/1987    Years since quitting: 31.6  . Smokeless tobacco: Never Used  Substance and Sexual Activity  . Alcohol use: Yes    Alcohol/week: 0.0 standard drinks    Comment: Socially; one drink per week.  . Drug use: No  . Sexual activity: Yes  Lifestyle  . Physical activity    Days per week: Not on file    Minutes per session: Not on file  . Stress: Not on file  Relationships  . Social Herbalist on phone:  Not on file    Gets together: Not on file    Attends religious service: Not on file    Active member of club or organization: Not on file    Attends meetings of clubs or organizations: Not on file    Relationship status: Not on file  . Intimate partner violence    Fear of current or ex partner: Not on file    Emotionally abused: Not on file    Physically abused: Not on file    Forced sexual activity: Not on file  Other Topics Concern  . 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    Married   No biological children    Past Surgical History:  Procedure Laterality Date  . HERNIA REPAIR  1994    Family History  Problem Relation Age of Onset  . Dementia Mother   . Miscarriages / [place] Mother   . Stroke Father        From dental visit  . Prostate cancer Father   . Cancer Father 56       prostate, treated with radiation  . Lung cancer Paternal Uncle   . Cancer Paternal Uncle        unknown/heavy smoker    No Known Allergies  Current Outpatient Medications on File Prior to Visit  Medication Sig Dispense Refill  . aspirin 81 MG tablet Take 81 mg by mouth daily.    . benazepril-hydrochlorthiazide (LOTENSIN HCT) 20-25 MG tablet TAKE ONE TABLET BY MOUTH DAILY 90 tablet 0   No current facility-administered medications on file prior to visit.     BP 122/66   Temp 97.9 F (36.6 C)   Ht 6' (1.829 m) Comment: WITH SHOES  Wt 215 lb (97.5 kg)   BMI 29.16 kg/m       Objective:   Physical Exam Vitals signs and nursing note reviewed.  Constitutional:      General: He is not in acute distress.    Appearance: Normal appearance. He is obese. He is not diaphoretic.  HENT:     Head: Normocephalic and atraumatic.     Right Ear: Tympanic membrane, ear canal and external ear normal. There is no impacted cerumen.     Left Ear: Tympanic membrane, ear canal and external ear normal. There is no impacted  cerumen.     Nose: Nose normal. No congestion or rhinorrhea.     Mouth/Throat:     Mouth: Mucous membranes are moist.     Pharynx: Oropharynx is clear. No oropharyngeal exudate.  Eyes:     General: No scleral icterus.       Right eye: No discharge.        Left eye: No discharge.  Conjunctiva/sclera: Conjunctivae normal.     Pupils: Pupils are equal, round, and reactive to light.  Neck:     Musculoskeletal: Normal range of motion and neck supple.     Thyroid: No thyromegaly.     Vascular: No JVD.     Trachea: No tracheal deviation.  Cardiovascular:     Rate and Rhythm: Normal rate and regular rhythm.     Heart sounds: Normal heart sounds. No murmur. No friction rub. No gallop.   Pulmonary:     Effort: Pulmonary effort is normal. No respiratory distress.     Breath sounds: Normal breath sounds. No stridor. No wheezing, rhonchi or rales.  Chest:     Chest wall: No tenderness.  Abdominal:     General: Bowel sounds are normal. There is no distension.     Palpations: Abdomen is soft. There is no mass.     Tenderness: There is no abdominal tenderness. There is no right CVA tenderness, left CVA tenderness, guarding or rebound.     Hernia: No hernia is present.  Musculoskeletal: Normal range of motion.        General: No swelling, tenderness, deformity or signs of injury.     Right lower leg: No edema.     Left lower leg: No edema.  Lymphadenopathy:     Cervical: No cervical adenopathy.  Skin:    General: Skin is warm and dry.     Capillary Refill: Capillary refill takes less than 2 seconds.     Coloration: Skin is not jaundiced or pale.     Findings: No bruising, erythema, lesion or rash.  Neurological:     General: No focal deficit present.     Mental Status: He is alert and oriented to person, place, and time. Mental status is at baseline.     Cranial Nerves: No cranial nerve deficit.     Sensory: No sensory deficit.     Motor: No weakness or abnormal muscle tone.      Coordination: Coordination normal.     Gait: Gait normal.     Deep Tendon Reflexes: Reflexes are normal and symmetric. Reflexes normal.  Psychiatric:        Mood and Affect: Mood normal.        Behavior: Behavior normal.        Thought Content: Thought content normal.        Judgment: Judgment normal.       Assessment & Plan:  1. Routine general medical examination at a health care facility - Needs to work on moderate weight loss through diet and exercise - Follow up in one year or sooner if needed  - CBC with Differential/Platelet - Comprehensive metabolic panel - Lipid panel - TSH  2. Essential hypertension - Well controlled. No change in medication - CBC with Differential/Platelet - Comprehensive metabolic panel - Lipid panel - TSH  3. Colon cancer screening  - Ambulatory referral to Gastroenterology  4. Need for vaccination against Streptococcus pneumoniae  - Pneumococcal polysaccharide vaccine 23-valent greater than or equal to 2yo subcutaneous/IM  5. Malignant neoplasm of prostate ([place]) - Follow up with Urology as directed  - PSA  Dorothyann Peng, NP

## 2019-01-04 ENCOUNTER — Other Ambulatory Visit: Payer: Self-pay | Admitting: Adult Health

## 2019-01-04 DIAGNOSIS — I10 Essential (primary) hypertension: Secondary | ICD-10-CM

## 2019-01-04 NOTE — Telephone Encounter (Signed)
Sent to the pharmacy by e-scribe. 

## 2019-01-07 ENCOUNTER — Other Ambulatory Visit: Payer: Self-pay | Admitting: Adult Health

## 2019-01-07 DIAGNOSIS — I10 Essential (primary) hypertension: Secondary | ICD-10-CM

## 2019-01-31 ENCOUNTER — Encounter: Payer: Self-pay | Admitting: Adult Health

## 2019-02-22 ENCOUNTER — Other Ambulatory Visit: Payer: Self-pay

## 2019-02-23 ENCOUNTER — Other Ambulatory Visit (INDEPENDENT_AMBULATORY_CARE_PROVIDER_SITE_OTHER): Payer: Medicare Other

## 2019-02-23 DIAGNOSIS — E785 Hyperlipidemia, unspecified: Secondary | ICD-10-CM | POA: Diagnosis not present

## 2019-02-23 LAB — LIPID PANEL
Cholesterol: 162 mg/dL (ref 0–200)
HDL: 45.2 mg/dL (ref >39.00)
LDL Cholesterol: 106 mg/dL — ABNORMAL HIGH (ref 0–99)
NonHDL: 116.43
Total CHOL/HDL Ratio: 4
Triglycerides: 50 mg/dL (ref 0.0–149.0)
VLDL: 10 mg/dL (ref 0.0–40.0)

## 2019-04-28 ENCOUNTER — Other Ambulatory Visit: Payer: Medicare Other

## 2019-05-02 ENCOUNTER — Ambulatory Visit: Payer: Medicare Other | Attending: Internal Medicine

## 2019-05-14 ENCOUNTER — Ambulatory Visit: Payer: Medicare Other

## 2019-05-22 ENCOUNTER — Ambulatory Visit: Payer: Medicare Other | Attending: Internal Medicine

## 2019-05-22 DIAGNOSIS — Z23 Encounter for immunization: Secondary | ICD-10-CM | POA: Insufficient documentation

## 2019-05-22 NOTE — Progress Notes (Signed)
   Covid-19 Vaccination Clinic  Name:  Jon Summers    MRN: [id number] DOB: [birth year]  05/22/2019  Jon Summers was observed post Covid-19 immunization for 15 minutes without incidence. He was provided with Vaccine Information Sheet and instruction to access the V-Safe system.   Jon Summers was instructed to call 911 with any severe reactions post vaccine: Marland Kitchen Difficulty breathing  . Swelling of your face and throat  . A fast heartbeat  . A bad rash all over your body  . Dizziness and weakness    Immunizations Administered    Name Date Dose VIS Date Route   Pfizer COVID-19 Vaccine 05/22/2019  8:20 AM 0.3 mL 03/03/2019 Intramuscular   Manufacturer: [place]   Lot: [id number]   [place]: [id number]

## 2019-06-20 ENCOUNTER — Ambulatory Visit: Payer: Medicare Other | Attending: Internal Medicine

## 2019-06-20 DIAGNOSIS — Z23 Encounter for immunization: Secondary | ICD-10-CM

## 2019-06-20 NOTE — Progress Notes (Signed)
   Covid-19 Vaccination Clinic  Name:  ZAYDE LAESSIG    MRN: [id number] DOB: 1945-10-02  06/20/2019  Mr. Chorney was observed post Covid-19 immunization for 15 minutes without incident. He was provided with Vaccine Information Sheet and instruction to access the V-Safe system.   Mr. Marsala was instructed to call 911 with any severe reactions post vaccine: Marland Kitchen Difficulty breathing  . Swelling of face and throat  . A fast heartbeat  . A bad rash all over body  . Dizziness and weakness   Immunizations Administered    Name Date Dose VIS Date Route   Pfizer COVID-19 Vaccine 06/20/2019  8:34 AM 0.3 mL 03/03/2019 Intramuscular   Manufacturer: [place]   Lot: [id number]   [place]: [id number]

## 2019-07-13 ENCOUNTER — Encounter: Payer: Self-pay | Admitting: Internal Medicine

## 2019-08-16 ENCOUNTER — Other Ambulatory Visit: Payer: Self-pay

## 2019-08-16 ENCOUNTER — Ambulatory Visit (AMBULATORY_SURGERY_CENTER): Payer: Self-pay

## 2019-08-16 VITALS — Ht 72.0 in | Wt 219.0 lb

## 2019-08-16 DIAGNOSIS — Z1211 Encounter for screening for malignant neoplasm of colon: Secondary | ICD-10-CM

## 2019-08-16 NOTE — Progress Notes (Signed)
No allergies to soy or egg Pt is not on blood thinners or diet pills Denies issues with sedation/intubation Denies atrial flutter/fib Denies constipation   Emmi instructions given to pt  Pt is aware of Covid safety and care partner requirements.  

## 2019-08-24 ENCOUNTER — Encounter: Payer: Self-pay | Admitting: Internal Medicine

## 2019-08-30 ENCOUNTER — Encounter: Payer: Self-pay | Admitting: Certified Registered Nurse Anesthetist

## 2019-08-31 ENCOUNTER — Encounter: Payer: Self-pay | Admitting: Internal Medicine

## 2019-08-31 ENCOUNTER — Ambulatory Visit (AMBULATORY_SURGERY_CENTER): Payer: Medicare Other | Admitting: Internal Medicine

## 2019-08-31 ENCOUNTER — Other Ambulatory Visit: Payer: Self-pay

## 2019-08-31 VITALS — BP 120/63 | HR 48 | Temp 96.6°F | Resp 11

## 2019-08-31 DIAGNOSIS — Z1211 Encounter for screening for malignant neoplasm of colon: Secondary | ICD-10-CM

## 2019-08-31 MED ORDER — SODIUM CHLORIDE 0.9 % IV SOLN: 500.0000 mL | Freq: Once | INTRAVENOUS | Status: DC

## 2019-08-31 NOTE — Progress Notes (Signed)
Report given to PACU, vss 

## 2019-08-31 NOTE — Op Note (Signed)
[facility] Patient Name: Jon Summers Procedure Date: 08/31/2019 2:02 PM MRN: [id number] Endoscopist: Gatha Mayer , MD Age: 74 Referring MD:  Date of Birth: [birth year] Gender: Male Account #: [id number] Procedure:                Colonoscopy Indications:              Screening for colorectal malignant neoplasm Medicines:                Propofol per Anesthesia, Monitored Anesthesia Care Procedure:                Pre-Anesthesia Assessment:                           - Prior to the procedure, a History and Physical                            was performed, and patient medications and                            allergies were reviewed. The patient's tolerance of                            previous anesthesia was also reviewed. The risks                            and benefits of the procedure and the sedation                            options and risks were discussed with the patient.                            All questions were answered, and informed consent                            was obtained. Prior Anticoagulants: The patient has                            taken no previous anticoagulant or antiplatelet                            agents. ASA Grade Assessment: III - A patient with                            severe systemic disease. After reviewing the risks                            and benefits, the patient was deemed in                            satisfactory condition to undergo the procedure.                           After obtaining informed consent, the colonoscope  was passed under direct vision. Throughout the                            procedure, the patient's blood pressure, pulse, and                            oxygen saturations were monitored continuously. The                            Colonoscope was introduced through the anus and                            advanced to the the cecum, identified by                             appendiceal orifice and ileocecal valve. The                            colonoscopy was performed without difficulty. The                            patient tolerated the procedure well. The quality                            of the bowel preparation was good. The bowel                            preparation used was Miralax via split dose                            instruction. The ileocecal valve, appendiceal                            orifice, and rectum were photographed. Scope In: 2:11:01 PM Scope Out: 2:27:15 PM Scope Withdrawal Time: 0 hours 12 minutes 54 seconds  Total Procedure Duration: 0 hours 16 minutes 14 seconds  Findings:                 The perianal and digital rectal examinations were                            normal.                           Multiple diverticula were found in the sigmoid                            colon.                           Multiple small localized angioectasias without                            bleeding were found in the rectum.  The exam was otherwise without abnormality on                            direct and retroflexion views. Complications:            No immediate complications. Estimated Blood Loss:     Estimated blood loss: none. Impression:               - Diverticulosis in the sigmoid colon.                           - Multiple non-bleeding colonic angioectasias. In                            rectum - from XRT for prostate cancer                           - The examination was otherwise normal on direct                            and retroflexion views.                           - No specimens collected. Recommendation:           - Patient has a contact number available for                            emergencies. The signs and symptoms of potential                            delayed complications were discussed with the                            patient. Return to normal activities tomorrow.                             Written discharge instructions were provided to the                            patient.                           - Resume previous diet.                           - Continue present medications.                           - No repeat colonoscopy due to current age (35                            years or older) and the absence of colonic polyps. Gatha Mayer, MD 08/31/2019 2:34:42 PM This report has been signed electronically.

## 2019-08-31 NOTE — Patient Instructions (Addendum)
No polyps or cancer seen.  You do have diverticulosis - thickened muscle rings and pouches in the colon wall. Please read the handout about this condition.  Also saw some increased blood vessels in the rectum from the prostate radiation. May bleed some but is not a dangerous issue.  You have "aged out" of further colonoscopy tests.  I appreciate the opportunity to care for you. Gatha Mayer, MD, FACG  YOU HAD AN ENDOSCOPIC PROCEDURE TODAY AT [facility]:   Refer to the procedure report that was given to you for any specific questions about what was found during the examination.  If the procedure report does not answer your questions, please call your gastroenterologist to clarify.  If you requested that your care partner not be given the details of your procedure findings, then the procedure report has been included in a sealed envelope for you to review at your convenience later.  YOU SHOULD EXPECT: Some feelings of bloating in the abdomen. Passage of more gas than usual.  Walking can help get rid of the air that was put into your GI tract during the procedure and reduce the bloating. If you had a lower endoscopy (such as a colonoscopy or flexible sigmoidoscopy) you may notice spotting of blood in your stool or on the toilet paper. If you underwent a bowel prep for your procedure, you may not have a normal bowel movement for a few days.  Please Note:  You might notice some irritation and congestion in your nose or some drainage.  This is from the oxygen used during your procedure.  There is no need for concern and it should clear up in a day or so.  SYMPTOMS TO REPORT IMMEDIATELY:   Following lower endoscopy (colonoscopy or flexible sigmoidoscopy):  Excessive amounts of blood in the stool  Significant tenderness or worsening of abdominal pains  Swelling of the abdomen that is new, acute  Fever of 100F or higher  For urgent or emergent issues, a gastroenterologist can  be reached at any hour by calling [phone]. Do not use MyChart messaging for urgent concerns.    DIET:  We do recommend a small meal at first, but then you may proceed to your regular diet.  Drink plenty of fluids but you should avoid alcoholic beverages for 24 hours.  ACTIVITY:  You should plan to take it easy for the rest of today and you should NOT DRIVE or use heavy machinery until tomorrow (because of the sedation medicines used during the test).    FOLLOW UP: Our staff will call the number listed on your records 48-72 hours following your procedure to check on you and address any questions or concerns that you may have regarding the information given to you following your procedure. If we do not reach you, we will leave a message.  We will attempt to reach you two times.  During this call, we will ask if you have developed any symptoms of COVID 19. If you develop any symptoms (ie: fever, flu-like symptoms, shortness of breath, cough etc.) before then, please call [phone].  If you test positive for Covid 19 in the 2 weeks post procedure, please call and report this information to [place].    If any biopsies were taken you will be contacted by phone or by letter within the next 1-3 weeks.  Please call us at [phone] if you have not heard about the biopsies in 3 weeks.    SIGNATURES/CONFIDENTIALITY: You  and/or your care partner have signed paperwork which will be entered into your electronic medical record.  These signatures attest to the fact that that the information above on your After Visit Summary has been reviewed and is understood.  Full responsibility of the confidentiality of this discharge information lies with you and/or your care-partner.

## 2019-08-31 NOTE — Progress Notes (Signed)
Pt's states no medical or surgical changes since previsit or office visit.  Temp- June Vitals- Donna 

## 2019-09-04 ENCOUNTER — Telehealth: Payer: Self-pay

## 2019-09-04 NOTE — Telephone Encounter (Signed)
Covid-19 screening questions   Do you now or have you had a fever in the last 14 days? No.  Do you have any respiratory symptoms of shortness of breath or cough now or in the last 14 days? No.  Do you have any family members or close contacts with diagnosed or suspected Covid-19 in the past 14 days? No.  Have you been tested for Covid-19 and found to be positive? No.       Follow up Call-  Call back number 08/31/2019  Post procedure Call Back phone  # [phone]  Permission to leave phone message Yes  Some recent data might be hidden     Patient questions:  Do you have a fever, pain , or abdominal swelling? No. Pain Score  0 *  Have you tolerated food without any problems? Yes.     Have you been able to return to your normal activities? Yes.    Do you have any questions about your discharge instructions: Diet   No. Medications  No. Follow up visit  No.  Do you have questions or concerns about your Care? No.  Actions: * If pain score is 4 or above: No action needed, pain <4.

## 2019-10-05 DIAGNOSIS — H2513 Age-related nuclear cataract, bilateral: Secondary | ICD-10-CM | POA: Diagnosis not present

## 2019-11-03 DIAGNOSIS — H25013 Cortical age-related cataract, bilateral: Secondary | ICD-10-CM | POA: Diagnosis not present

## 2019-11-03 DIAGNOSIS — H18413 Arcus senilis, bilateral: Secondary | ICD-10-CM | POA: Diagnosis not present

## 2019-11-03 DIAGNOSIS — H2513 Age-related nuclear cataract, bilateral: Secondary | ICD-10-CM | POA: Diagnosis not present

## 2019-11-03 DIAGNOSIS — H2511 Age-related nuclear cataract, right eye: Secondary | ICD-10-CM | POA: Diagnosis not present

## 2019-11-03 DIAGNOSIS — H25043 Posterior subcapsular polar age-related cataract, bilateral: Secondary | ICD-10-CM | POA: Diagnosis not present

## 2019-11-13 ENCOUNTER — Telehealth: Payer: Self-pay | Admitting: Adult Health

## 2019-11-13 NOTE — Telephone Encounter (Signed)
Pt is calling to see if he can get the full Tdap shot when he comes in on the 08/26  Please advise

## 2019-11-14 NOTE — Telephone Encounter (Signed)
Explained to patient that Medicare does not pay for tdap in the office.  Okay to go to the pharmacy for Tdap Vaccine.

## 2019-11-16 ENCOUNTER — Ambulatory Visit (INDEPENDENT_AMBULATORY_CARE_PROVIDER_SITE_OTHER): Payer: Medicare Other | Admitting: Adult Health

## 2019-11-16 ENCOUNTER — Encounter: Payer: Self-pay | Admitting: Adult Health

## 2019-11-16 ENCOUNTER — Other Ambulatory Visit: Payer: Self-pay

## 2019-11-16 VITALS — BP 124/80 | HR 49 | Temp 97.8°F | Ht 71.25 in | Wt 210.2 lb

## 2019-11-16 DIAGNOSIS — Z131 Encounter for screening for diabetes mellitus: Secondary | ICD-10-CM | POA: Diagnosis not present

## 2019-11-16 DIAGNOSIS — I10 Essential (primary) hypertension: Secondary | ICD-10-CM | POA: Diagnosis not present

## 2019-11-16 DIAGNOSIS — Z0000 Encounter for general adult medical examination without abnormal findings: Secondary | ICD-10-CM

## 2019-11-16 DIAGNOSIS — Z8546 Personal history of malignant neoplasm of prostate: Secondary | ICD-10-CM | POA: Diagnosis not present

## 2019-11-16 NOTE — Progress Notes (Signed)
Subjective:    Patient ID: Jon Summers, male    DOB: 10/31/1945, 74 y.o.   MRN: [id number]  HPI Patient presents for yearly preventative medicine examination. 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  Hypertension-is well controlled with Lotensin/HCTZ.  He denies chest pain, shortness of breath, dizziness, lightheadedness, headaches, or syncopal episodes.  History of prostate cancer-treated in 2018.  He underwent radiation therapy from 10/20/2016 to 12/15/2016.   All immunizations and health maintenance protocols were reviewed with the patient and needed orders were placed.  He is up-to-date on routine vaccinations  Appropriate screening laboratory values were ordered for the patient including screening of hyperlipidemia, renal function and hepatic function. If indicated by BPH, a PSA was ordered.  Medication reconciliation,  past medical history, social history, problem list and allergies were reviewed in detail with the patient  Goals were established with regard to weight loss, exercise, and  diet in compliance with medications. He has been doing intermittent fasting, weight watcher, and calorie counting. He does not have a formal exercise regimen but does walk and work in the yard frequently.   Wt Readings from Last 3 Encounters:  11/16/19 210 lb 3.2 oz (95.3 kg)  08/16/19 219 lb (99.3 kg)  11/15/18 215 lb (97.5 kg)    End of life planning was discussed.  He has an advanced directive and living well  He is up-to-date on routine colon cancer screening as well as dental and vision screens.   Review of Systems  Constitutional: Negative.   HENT: Negative.   Eyes: Negative.   Respiratory: Negative.   Cardiovascular: Negative.   Gastrointestinal: Negative.     Endocrine: Negative.   Genitourinary: Negative.   Musculoskeletal: Negative.   Skin: Negative.   Allergic/Immunologic: Negative.   Neurological: Negative.   Hematological: Negative.   Psychiatric/Behavioral: Negative.   All other systems reviewed and are negative.  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[place])    From medical records in [place] History   Socioeconomic History   Marital status: Married    Spouse name: Not on file   Number of children: Not on file   Years of education: Not on file   Highest education level: Not on file  Occupational History   Not on file  Tobacco Use   Smoking status: Former Smoker    Packs/day: 1.00    Years: 25.00    Pack years: 25.00    Types: Cigarettes    Quit date: 03/24/1987    Years since quitting: 32.6   Smokeless tobacco: Never Used  Vaping Use   Vaping Use: Never used  Substance and Sexual Activity   Alcohol use: Yes    Alcohol/week: 0.0 standard drinks    Comment: Socially; one drink per week.   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    Married   No biological children   Social Determinants of Radio broadcast assistant Strain:    Difficulty of Paying Living Expenses: Not on file  Food Insecurity:    Worried About Charity fundraiser in the Last Year: Not on file   [employer] of Food in the Last Year: Not on file  Transportation Needs:    Lack of Transportation (Medical): Not on file   Lack of Transportation (Non-Medical): Not on file  Physical Activity:    Days of Exercise per  Week: Not on file   Minutes of Exercise per Session: Not on file  Stress:    Feeling of Stress : Not on file  Social Connections:    Frequency of Communication with Friends and Family: Not on file   Frequency of Social Gatherings with Friends and Family: Not on file   Attends Religious Services: Not on file   Active Member of Clubs or Organizations: Not on file   Attends Archivist Meetings: Not on file   Marital Status: Not on file  Intimate Partner Violence:    Fear of Current or Ex-Partner: Not on file   Emotionally Abused: Not on file   Physically Abused: Not on file   Sexually Abused: Not on file    Past Surgical History:  Procedure Laterality Date   COLONOSCOPY     . 57 yrs ago   [place]    Family History  Problem Relation Age of Onset   Dementia Mother    51 / [place] Mother    Stroke Father        From dental visit   Prostate cancer Father    Cancer Father 37       prostate, treated with radiation   Lung cancer Paternal Uncle    Cancer Paternal Uncle        unknown/heavy smoker   Colon cancer Neg Hx    Colon polyps Neg Hx    Esophageal cancer Neg Hx    Rectal cancer Neg Hx    Stomach cancer Neg Hx     No Known Allergies  Current Outpatient Medications on File Prior to Visit  Medication Sig Dispense Refill   aspirin 81 MG tablet Take 81 mg by mouth daily.     benazepril-hydrochlorthiazide (LOTENSIN HCT) 20-25 MG tablet TAKE ONE TABLET BY MOUTH DAILY 90 tablet 3   DUREZOL 0.05 % EMUL Place 1 drop into the right eye 3 (three) times daily. (Patient not taking: Reported on 11/16/2019)     PROLENSA 0.07 % SOLN  (Patient not taking: Reported on 11/16/2019)     No current facility-administered medications on file prior to visit.    BP 124/80    Pulse (!) 49    Temp 97.8 F (36.6 C) (Oral)    Ht 5' 11.25" (1.81 m)    Wt 210 lb 3.2 oz (95.3 kg)    SpO2 98%    BMI 29.11 kg/m       Objective:    Physical Exam Vitals and nursing note reviewed.  Constitutional:      General: He is not in acute distress.    Appearance: Normal appearance. He is well-developed and normal weight.  HENT:     Head: Normocephalic and atraumatic.     Right Ear: Tympanic membrane, ear canal and external ear normal. There is no impacted cerumen.  Left Ear: Tympanic membrane, ear canal and external ear normal. There is no impacted cerumen.     Nose: Nose normal. No congestion or rhinorrhea.     Mouth/Throat:     Mouth: Mucous membranes are moist.     Pharynx: Oropharynx is clear. No oropharyngeal exudate or posterior oropharyngeal erythema.  Eyes:     General:        Right eye: No discharge.        Left eye: No discharge.     Extraocular Movements: Extraocular movements intact.     Conjunctiva/sclera: Conjunctivae normal.     Pupils: Pupils are equal, round, and reactive to light.  Neck:     Vascular: No carotid bruit.     Trachea: No tracheal deviation.  Cardiovascular:     Rate and Rhythm: Normal rate and regular rhythm.     Pulses: Normal pulses.     Heart sounds: Normal heart sounds. No murmur heard.  No friction rub. No gallop.   Pulmonary:     Effort: Pulmonary effort is normal. No respiratory distress.     Breath sounds: Normal breath sounds. No stridor. No wheezing, rhonchi or rales.  Chest:     Chest wall: No tenderness.  Abdominal:     General: Bowel sounds are normal. There is no distension.     Palpations: Abdomen is soft. There is no mass.     Tenderness: There is no abdominal tenderness. There is no right CVA tenderness, left CVA tenderness, guarding or rebound.     Hernia: No hernia is present.  Musculoskeletal:        General: No swelling, tenderness, deformity or signs of injury. Normal range of motion.     Right lower leg: No edema.     Left lower leg: No edema.  Lymphadenopathy:     Cervical: No cervical adenopathy.  Skin:    General: Skin is warm and dry.     Capillary  Refill: Capillary refill takes less than 2 seconds.     Coloration: Skin is not jaundiced or pale.     Findings: No bruising, erythema, lesion or rash.  Neurological:     General: No focal deficit present.     Mental Status: He is alert and oriented to person, place, and time.     Cranial Nerves: No cranial nerve deficit.     Sensory: No sensory deficit.     Motor: No weakness.     Coordination: Coordination normal.     Gait: Gait normal.     Deep Tendon Reflexes: Reflexes normal.  Psychiatric:        Mood and Affect: Mood normal.        Behavior: Behavior normal.        Thought Content: Thought content normal.        Judgment: Judgment normal.       Assessment & Plan:   1. Routine general medical examination at a health care facility - Benign exam  - Continue to eat healthy and exercise - Follow up in one year or sooner if needed - CBC with Differential/Platelet; Future - Lipid panel; Future - TSH; Future - Hemoglobin A1c; Future - CMP with eGFR([facility]); Future  2. Essential hypertension - Well controlled. No change in medications  - CBC with Differential/Platelet; Future - Lipid panel; Future - TSH; Future - Hemoglobin A1c; Future - CMP with eGFR([facility]); Future  3. History of prostate cancer  - PSA; Future   Dorothyann Peng, NP

## 2019-11-16 NOTE — Patient Instructions (Signed)
It was great seeing you today   You have done great with weight loss - keep it up!   We will follow up with you regarding your blood work    Health Maintenance, Male A healthy lifestyle and preventative care can promote health and wellness.  Maintain regular health, dental, and eye exams.  Eat a healthy diet. Foods like vegetables, fruits, whole grains, low-fat dairy products, and lean protein foods contain the nutrients you need and are low in calories. Decrease your intake of foods high in solid fats, added sugars, and salt. Get information about a proper diet from your health care provider, if necessary.  Regular physical exercise is one of the most important things you can do for your health. Most adults should get at least 150 minutes of moderate-intensity exercise (any activity that increases your heart rate and causes you to sweat) each week. In addition, most adults need muscle-strengthening exercises on 2 or more days a week.   Maintain a healthy weight. The body mass index (BMI) is a screening tool to identify possible weight problems. It provides an estimate of body fat based on height and weight. Your health care provider can find your BMI and can help you achieve or maintain a healthy weight. For males 20 years and older:  A BMI below 18.5 is considered underweight.  A BMI of 18.5 to 24.9 is normal.  A BMI of 25 to 29.9 is considered overweight.  A BMI of 30 and above is considered obese.  Maintain normal blood lipids and cholesterol by exercising and minimizing your intake of saturated fat. Eat a balanced diet with plenty of fruits and vegetables. Blood tests for lipids and cholesterol should begin at age 4 and be repeated every 5 years. If your lipid or cholesterol levels are high, you are over age 8, or you are at high risk for heart disease, you may need your cholesterol levels checked more frequently.Ongoing high lipid and cholesterol levels should be treated with  medicines if diet and exercise are not working.  If you smoke, find out from your health care provider how to quit. If you do not use tobacco, do not start.  Lung cancer screening is recommended for adults aged 5-80 years who are at high risk for developing lung cancer because of a history of smoking. A yearly low-dose CT scan of the lungs is recommended for people who have at least a 30-pack-year history of smoking and are current smokers or have quit within the past 15 years. A pack year of smoking is smoking an average of 1 pack of cigarettes a day for 1 year (for example, a 30-pack-year history of smoking could mean smoking 1 pack a day for 30 years or 2 packs a day for 15 years). Yearly screening should continue until the smoker has stopped smoking for at least 15 years. Yearly screening should be stopped for people who develop a health problem that would prevent them from having lung cancer treatment.  If you choose to drink alcohol, do not have more than 2 drinks per day. One drink is considered to be 12 oz (360 mL) of beer, 5 oz (150 mL) of wine, or 1.5 oz (45 mL) of liquor.  Avoid the use of street drugs. Do not share needles with anyone. Ask for help if you need support or instructions about stopping the use of drugs.  High blood pressure causes heart disease and increases the risk of stroke. High blood pressure is more  likely to develop in:  People who have blood pressure in the end of the normal range (100-139/85-89 mm Hg).  People who are overweight or obese.  People who are African American.  If you are 63-61 years of age, have your blood pressure checked every 3-5 years. If you are 52 years of age or older, have your blood pressure checked every year. You should have your blood pressure measured twice--once when you are at a hospital or clinic, and once when you are not at a hospital or clinic. Record the average of the two measurements. To check your blood pressure when you are not  at a hospital or clinic, you can use:  An automated blood pressure machine at a pharmacy.  A home blood pressure monitor.  If you are 70-82 years old, ask your health care provider if you should take aspirin to prevent heart disease.  Diabetes screening involves taking a blood sample to check your fasting blood sugar level. This should be done once every 3 years after age 32 if you are at a normal weight and without risk factors for diabetes. Testing should be considered at a younger age or be carried out more frequently if you are overweight and have at least 1 risk factor for diabetes.  Colorectal cancer can be detected and often prevented. Most routine colorectal cancer screening begins at the age of 71 and continues through age 3. However, your health care provider may recommend screening at an earlier age if you have risk factors for colon cancer. On a yearly basis, your health care provider may provide home test kits to check for hidden blood in the stool. A small camera at the end of a tube may be used to directly examine the colon (sigmoidoscopy or colonoscopy) to detect the earliest forms of colorectal cancer. Talk to your health care provider about this at age 51 when routine screening begins. A direct exam of the colon should be repeated every 5-10 years through age 1, unless early forms of precancerous polyps or small growths are found.  People who are at an increased risk for hepatitis B should be screened for this virus. You are considered at high risk for hepatitis B if:  You were born in a country where hepatitis B occurs often. Talk with your health care provider about which countries are considered high risk.  Your parents were born in a high-risk country and you have not received a shot to protect against hepatitis B (hepatitis B vaccine).  You have HIV or AIDS.  You use needles to inject street drugs.  You live with, or have sex with, someone who has hepatitis B.  You  are a man who has sex with other men (MSM).  You get hemodialysis treatment.  You take certain medicines for conditions like cancer, organ transplantation, and autoimmune conditions.  Hepatitis C blood testing is recommended for all people born from [birth year] through 1963 and any individual with known risk factors for hepatitis C.  Healthy men should no longer receive prostate-specific antigen (PSA) blood tests as part of routine cancer screening. Talk to your health care provider about prostate cancer screening.  Testicular cancer screening is not recommended for adolescents or adult males who have no symptoms. Screening includes self-exam, a health care provider exam, and other screening tests. Consult with your health care provider about any symptoms you have or any concerns you have about testicular cancer.  Practice safe sex. Use condoms and avoid high-risk sexual practices  to reduce the spread of sexually transmitted infections (STIs).  You should be screened for STIs, including gonorrhea and chlamydia if:  You are sexually active and are younger than 24 years.  You are older than 24 years, and your health care provider tells you that you are at risk for this type of infection.  Your sexual activity has changed since you were last screened, and you are at an increased risk for chlamydia or gonorrhea. Ask your health care provider if you are at risk.  If you are at risk of being infected with HIV, it is recommended that you take a prescription medicine daily to prevent HIV infection. This is called pre-exposure prophylaxis (PrEP). You are considered at risk if:  You are a man who has sex with other men (MSM).  You are a heterosexual man who is sexually active with multiple partners.  You take drugs by injection.  You are sexually active with a partner who has HIV.  Talk with your health care provider about whether you are at high risk of being infected with HIV. If you choose to begin  PrEP, you should first be tested for HIV. You should then be tested every 3 months for as long as you are taking PrEP.  Use sunscreen. Apply sunscreen liberally and repeatedly throughout the day. You should seek shade when your shadow is shorter than you. Protect yourself by wearing long sleeves, pants, a wide-brimmed hat, and sunglasses year round whenever you are outdoors.  Tell your health care provider of new moles or changes in moles, especially if there is a change in shape or color. Also, tell your health care provider if a mole is larger than the size of a pencil eraser.  A one-time screening for abdominal aortic aneurysm (AAA) and surgical repair of large AAAs by ultrasound is recommended for men aged 34-75 years who are current or former smokers.  Stay current with your vaccines (immunizations).   This information is not intended to replace advice given to you by your health care provider. Make sure you discuss any questions you have with your health care provider.   Document Released: 09/05/2007 Document Revised: 03/30/2014 Document Reviewed: 08/04/2010 Elsevier Interactive Patient Education [employer].

## 2019-11-16 NOTE — Addendum Note (Signed)
Addended by: Marrion Coy on: 11/16/2019 08:31 AM   Modules accepted: Orders

## 2019-11-17 LAB — HEMOGLOBIN A1C
Hgb A1c MFr Bld: 5.4 % of total Hgb (ref <5.7)
Mean Plasma Glucose: 108 (calc)
eAG (mmol/L): 6 (calc)

## 2019-11-17 LAB — COMPLETE METABOLIC PANEL WITH GFR
AG Ratio: 1.8 (calc) (ref 1.0–2.5)
ALT: 11 U/L (ref 9–46)
AST: 11 U/L (ref 10–35)
Albumin: 4.1 g/dL (ref 3.6–5.1)
Alkaline phosphatase (APISO): 61 U/L (ref 35–144)
BUN: 22 mg/dL (ref 7–25)
CO2: 30 mmol/L (ref 20–32)
Calcium: 8.6 mg/dL (ref 8.6–10.3)
Chloride: 103 mmol/L (ref 98–110)
Creat: 0.94 mg/dL (ref 0.70–1.18)
GFR, Est African American: 94 mL/min/{1.73_m2} (ref >=60)
GFR, Est Non African American: 81 mL/min/{1.73_m2} (ref >=60)
Globulin: 2.3 g/dL (calc) (ref 1.9–3.7)
Glucose, Bld: 83 mg/dL (ref 65–99)
Potassium: 4.3 mmol/L (ref 3.5–5.3)
Sodium: 140 mmol/L (ref 135–146)
Total Bilirubin: 1.2 mg/dL (ref 0.2–1.2)
Total Protein: 6.4 g/dL (ref 6.1–8.1)

## 2019-11-17 LAB — CBC WITH DIFFERENTIAL/PLATELET
Absolute Monocytes: 378 cells/uL (ref 200–950)
Basophils Absolute: 28 cells/uL (ref 0–200)
Basophils Relative: 0.4 %
Eosinophils Absolute: 196 cells/uL (ref 15–500)
Eosinophils Relative: 2.8 %
HCT: 39.2 % (ref 38.5–50.0)
Hemoglobin: 12.8 g/dL — ABNORMAL LOW (ref 13.2–17.1)
Lymphs Abs: 1638 cells/uL (ref 850–3900)
MCH: 30.9 pg (ref 27.0–33.0)
MCHC: 32.7 g/dL (ref 32.0–36.0)
MCV: 94.7 fL (ref 80.0–100.0)
MPV: 10.6 fL (ref 7.5–12.5)
Monocytes Relative: 5.4 %
Neutro Abs: 4760 cells/uL (ref 1500–7800)
Neutrophils Relative %: 68 %
Platelets: 223 10*3/uL (ref 140–400)
RBC: 4.14 10*6/uL — ABNORMAL LOW (ref 4.20–5.80)
RDW: 12.8 % (ref 11.0–15.0)
Total Lymphocyte: 23.4 %
WBC: 7 10*3/uL (ref 3.8–10.8)

## 2019-11-17 LAB — TSH: TSH: 0.9 mIU/L (ref 0.40–4.50)

## 2019-11-17 LAB — LIPID PANEL
Cholesterol: 160 mg/dL (ref <200)
HDL: 46 mg/dL (ref >=40)
LDL Cholesterol (Calc): 101 mg/dL (calc) — ABNORMAL HIGH
Non-HDL Cholesterol (Calc): 114 mg/dL (calc) (ref <130)
Total CHOL/HDL Ratio: 3.5 (calc) (ref <5.0)
Triglycerides: 50 mg/dL (ref <150)

## 2019-11-17 LAB — PSA: PSA: 0.4 ng/mL (ref <=4.0)

## 2019-12-18 DIAGNOSIS — H2511 Age-related nuclear cataract, right eye: Secondary | ICD-10-CM | POA: Diagnosis not present

## 2019-12-19 DIAGNOSIS — H2512 Age-related nuclear cataract, left eye: Secondary | ICD-10-CM | POA: Diagnosis not present

## 2020-01-18 DIAGNOSIS — H2512 Age-related nuclear cataract, left eye: Secondary | ICD-10-CM | POA: Diagnosis not present

## 2020-01-19 DIAGNOSIS — H2512 Age-related nuclear cataract, left eye: Secondary | ICD-10-CM | POA: Diagnosis not present

## 2020-02-25 ENCOUNTER — Other Ambulatory Visit: Payer: Self-pay | Admitting: Adult Health

## 2020-02-25 DIAGNOSIS — I10 Essential (primary) hypertension: Secondary | ICD-10-CM

## 2020-04-02 ENCOUNTER — Telehealth: Payer: Self-pay | Admitting: Adult Health

## 2020-04-02 NOTE — Telephone Encounter (Signed)
Left message for patient to call back and schedule Medicare Annual Wellness Visit (AWV) either virtually or in office.   Last AWV no information please schedule at anytime with [facility] Nurse Health Advisor 1 or 2   This should be a 45 minute visit. 

## 2020-06-04 ENCOUNTER — Telehealth: Payer: Self-pay | Admitting: Adult Health

## 2020-06-04 NOTE — Telephone Encounter (Signed)
Spoke with patient to schedule AWV  Patient stated he received letter last month letting him know cory is not in network with [employer].  He wanted to know if cory was in network   Please advise

## 2020-06-11 NOTE — Telephone Encounter (Signed)
Spoke with patient to r/s his awv appointment.  Patient would like a call back regarding Tommi Rumps not being in network

## 2020-06-19 ENCOUNTER — Ambulatory Visit: Payer: Medicare Other

## 2020-06-20 NOTE — Telephone Encounter (Signed)
The patient is wanting to know why Jon Summers is not in network and he has an appointment coming up and would like a call back.

## 2020-07-04 ENCOUNTER — Ambulatory Visit: Payer: Medicare Other

## 2020-10-14 ENCOUNTER — Other Ambulatory Visit: Payer: Self-pay | Admitting: Adult Health

## 2020-10-14 DIAGNOSIS — I10 Essential (primary) hypertension: Secondary | ICD-10-CM

## 2020-11-19 ENCOUNTER — Telehealth: Payer: Self-pay

## 2020-11-19 ENCOUNTER — Ambulatory Visit (INDEPENDENT_AMBULATORY_CARE_PROVIDER_SITE_OTHER): Payer: Medicare Other

## 2020-11-19 DIAGNOSIS — Z0000 Encounter for general adult medical examination without abnormal findings: Secondary | ICD-10-CM

## 2020-11-19 NOTE — Telephone Encounter (Signed)
Patient did not connect t

## 2020-11-19 NOTE — Progress Notes (Signed)
Subjective:   Jon Summers is a 75 y.o. male who presents for an Initial Medicare Annual Wellness Visit.  Patient unable to connect to my chart . I connected with Jon Summers today by telephone and verified that I am speaking with the correct person using two identifiers. Location patient: home Location provider: work Persons participating in the virtual visit: patient, provider.   I discussed the limitations, risks, security and privacy concerns of performing an evaluation and management service by telephone and the availability of in person appointments. I also discussed with the patient that there may be a patient responsible charge related to this service. The patient expressed understanding and verbally consented to this telephonic visit.    Interactive audio and video telecommunications were attempted between this provider and patient, however failed, due to patient having technical difficulties OR patient did not have access to video capability.  We continued and completed visit with audio only.    Review of Systems    N/a       Objective:    There were no vitals filed for this visit. There is no height or weight on file to calculate BMI.  Advanced Directives 01/14/2017 04/23/2016  Does Patient Have a Medical Advance Directive? Yes Yes  Type of Paramedic of [place];Living will [place];Living will    Current Medications (verified) Outpatient Encounter Medications as of 11/19/2020  Medication Sig   aspirin 81 MG tablet Take 81 mg by mouth daily.   benazepril-hydrochlorthiazide (LOTENSIN HCT) 20-25 MG tablet TAKE ONE TABLET BY MOUTH DAILY   DUREZOL 0.05 % EMUL Place 1 drop into the right eye 3 (three) times daily. (Patient not taking: Reported on 11/16/2019)   PROLENSA 0.07 % SOLN  (Patient not taking: Reported on 11/16/2019)   No facility-administered encounter medications on file as of 11/19/2020.    Allergies  (verified) Patient has no known allergies.   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 43 yrs ago   [place]   Family History  Problem Relation Age of Onset   Dementia Mother    5 / [place] Mother    Stroke Father        From dental visit   Prostate cancer Father    Cancer Father 53       prostate, treated with radiation   Lung cancer Paternal Uncle    Cancer Paternal Uncle        unknown/heavy smoker   Colon cancer Neg Hx    Colon polyps Neg Hx    Esophageal cancer Neg Hx    Rectal cancer Neg Hx    Stomach cancer Neg Hx    Social History   Socioeconomic History   Marital status: Married    Spouse name: Not on file   Number of children: Not on file   Years of education: Not on file   Highest education level: Not on file  Occupational History   Not on  file  Tobacco Use   Smoking status: Former    Packs/day: 1.00    Years: 25.00    Pack years: 25.00    Types: Cigarettes    Quit date: 03/24/1987    Years since quitting: 33.6   Smokeless tobacco: Never  Vaping Use   Vaping Use: Never used  Substance and Sexual Activity   Alcohol use: Yes    Alcohol/week: 0.0 standard drinks    Comment: Socially; one drink per week.   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    Married   No biological children   Social Determinants of Radio broadcast assistant Strain: Not on file  Food Insecurity: Not on file  Transportation  Needs: Not on file  Physical Activity: Not on file  Stress: Not on file  Social Connections: Not on file    Tobacco Counseling Counseling given: Not Answered   Clinical Intake:                 Diabetic?no         Activities of Daily Living No flowsheet data found.  Patient Care Team: Dorothyann Peng, NP as PCP - General (Family Medicine) Ardis Hughs, MD as Attending Physician (Urology)  Indicate any recent Medical Services you may have received from other than [facility] providers in the past year (date may be approximate).     Assessment:   This is a routine wellness examination for Jon Summers.  Hearing/Vision screen No results found.  Dietary issues and exercise activities discussed:     Goals Addressed   None    Depression Screen PHQ 2/9 Scores 11/16/2019 11/15/2018 01/14/2017 08/05/2016 12/11/2014 12/11/2014  PHQ - 2 Score 0 0 0 0 0 0  PHQ- 9 Score 0 - - - - -  Exception Documentation - - - - - Patient refusal    Fall Risk Fall Risk  11/16/2019 11/15/2018 01/14/2017 08/05/2016 12/11/2014  Falls in the past year? 0 0 No No No    FALL RISK PREVENTION PERTAINING TO THE HOME:  Any stairs in or around the home? Yes  If so, are there any without handrails? No  Home free of loose throw rugs in walkways, pet beds, electrical cords, etc? Yes  Adequate lighting in your home to reduce risk of falls? Yes   ASSISTIVE DEVICES UTILIZED TO PREVENT FALLS:  Life alert? No  Use of a cane, walker or w/c? No  Grab bars in the bathroom? Yes  Shower chair or bench in shower? Yes  Elevated toilet seat or a handicapped toilet? No    Cognitive Function:  Normal cognitive status assessed by direct observation by this Nurse Health Advisor. No abnormalities found.        Immunizations Immunization History  Administered Date(s) Administered   Fluad Quad(high Dose 65+) 11/15/2018   Influenza, High Dose Seasonal PF 12/11/2014   PFIZER(Purple Top)SARS-COV-2 Vaccination  05/22/2019, 06/20/2019   Pneumococcal Conjugate-13 06/22/2017   Pneumococcal Polysaccharide-23 11/15/2018   Tdap 04/15/2010, 11/14/2019   Zoster, Live 05/18/2009    TDAP status: Up to date  Flu Vaccine status: Up to date  Pneumococcal vaccine status: Up to date  Covid-19 vaccine status: Completed vaccines  Qualifies for Shingles Vaccine? Yes   Zostavax completed No   Shingrix Completed?: No.    Education has been provided regarding  the importance of this vaccine. Patient has been advised to call insurance company to determine out of pocket expense if they have not yet received this vaccine. Advised may also receive vaccine at local pharmacy or Health Dept. Verbalized acceptance and understanding.  Screening Tests Health Maintenance  Topic Date Due   Zoster Vaccines- Shingrix (1 of 2) Never done   COVID-19 Vaccine (3 - Pfizer risk series) 07/18/2019   INFLUENZA VACCINE  10/21/2020   COLONOSCOPY (Pts 45-68yr Insurance coverage will need to be confirmed)  08/30/2029   TETANUS/TDAP  11/13/2029   Hepatitis C Screening  Completed   PNA vac Low Risk Adult  Completed   HPV VACCINES  Aged Out    Health Maintenance  Health Maintenance Due  Topic Date Due   Zoster Vaccines- Shingrix (1 of 2) Never done   COVID-19 Vaccine (3 - Pfizer risk series) 07/18/2019   INFLUENZA VACCINE  10/21/2020    Colorectal cancer screening: Type of screening: Colonoscopy. Completed 06/102021. Repeat every 10 years  Lung Cancer Screening: (Low Dose CT Chest recommended if Age 73-80years, 30 pack-year currently smoking OR have quit w/in 15years.) does not qualify.   Lung Cancer Screening Referral: n/a  Additional Screening:  Hepatitis C Screening: does not qualify; Completed 06/22/2017  Vision Screening: Recommended annual ophthalmology exams for early detection of glaucoma and other disorders of the eye. Is the patient up to date with their annual eye exam?  No  Who is the provider or what is the  name of the office in which the patient attends annual eye exams? Dr.Hutto  If pt is not established with a provider, would they like to be referred to a provider to establish care? No .   Dental Screening: Recommended annual dental exams for proper oral hygiene  Community Resource Referral / Chronic Care Management: CRR required this visit?  No   CCM required this visit?  No      Plan:     I have personally reviewed and noted the following in the patient's chart:   Medical and social history Use of alcohol, tobacco or illicit drugs  Current medications and supplements including opioid prescriptions. Patient is not currently taking opioid prescriptions. Functional ability and status Nutritional status Physical activity Advanced directives List of other physicians Hospitalizations, surgeries, and ER visits in previous 12 months Vitals Screenings to include cognitive, depression, and falls Referrals and appointments  In addition, I have reviewed and discussed with patient certain preventive protocols, quality metrics, and best practice recommendations. A written personalized care plan for preventive services as well as general preventive health recommendations were provided to patient.     LRandel Pigg LPN   [id number]  Nurse Notes: none

## 2020-11-19 NOTE — Patient Instructions (Signed)
Jon Summers , Thank you for taking time to come for your Medicare Wellness Visit. I appreciate your ongoing commitment to your health goals. Please review the following plan we discussed and let me know if I can assist you in the future.   Screening recommendations/referrals: Colonoscopy:08/31/2019 Recommended yearly ophthalmology/optometry visit for glaucoma screening and checkup Recommended yearly dental visit for hygiene and checkup  Vaccinations: Influenza vaccine: Due in fall 2022  Pneumococcal vaccine: completed series  Tdap vaccine: 11/14/2019 Shingles vaccine: declined     Advanced directives: will provide copies   Conditions/risks identified: none   Next appointment: none   Preventive Care 73 Years and Older, Male Preventive care refers to lifestyle choices and visits with your health care provider that can promote health and wellness. What does preventive care include? A yearly physical exam. This is also called an annual well check. Dental exams once or twice a year. Routine eye exams. Ask your health care provider how often you should have your eyes checked. Personal lifestyle choices, including: Daily care of your teeth and gums. Regular physical activity. Eating a healthy diet. Avoiding tobacco and drug use. Limiting alcohol use. Practicing safe sex. Taking low doses of aspirin every day. Taking vitamin and mineral supplements as recommended by your health care provider. What happens during an annual well check? The services and screenings done by your health care provider during your annual well check will depend on your age, overall health, lifestyle risk factors, and family history of disease. Counseling  Your health care provider may ask you questions about your: Alcohol use. Tobacco use. Drug use. Emotional well-being. Home and relationship well-being. Sexual activity. Eating habits. History of falls. Memory and ability to understand  (cognition). Work and work Statistician. Screening  You may have the following tests or measurements: Height, weight, and BMI. Blood pressure. Lipid and cholesterol levels. These may be checked every 5 years, or more frequently if you are over 55 years old. Skin check. Lung cancer screening. You may have this screening every year starting at age 40 if you have a 30-pack-year history of smoking and currently smoke or have quit within the past 15 years. Fecal occult blood test (FOBT) of the stool. You may have this test every year starting at age 75. Flexible sigmoidoscopy or colonoscopy. You may have a sigmoidoscopy every 5 years or a colonoscopy every 10 years starting at age 54. Prostate cancer screening. Recommendations will vary depending on your family history and other risks. Hepatitis C blood test. Hepatitis B blood test. Sexually transmitted disease (STD) testing. Diabetes screening. This is done by checking your blood sugar (glucose) after you have not eaten for a while (fasting). You may have this done every 1-3 years. Abdominal aortic aneurysm (AAA) screening. You may need this if you are a current or former smoker. Osteoporosis. You may be screened starting at age 30 if you are at high risk. Talk with your health care provider about your test results, treatment options, and if necessary, the need for more tests. Vaccines  Your health care provider may recommend certain vaccines, such as: Influenza vaccine. This is recommended every year. Tetanus, diphtheria, and acellular pertussis (Tdap, Td) vaccine. You may need a Td booster every 10 years. Zoster vaccine. You may need this after age 57. Pneumococcal 13-valent conjugate (PCV13) vaccine. One dose is recommended after age 6. Pneumococcal polysaccharide (PPSV23) vaccine. One dose is recommended after age 40. Talk to your health care provider about which screenings and vaccines you need  and how often you need them. This  information is not intended to replace advice given to you by your health care provider. Make sure you discuss any questions you have with your health care provider. Document Released: 04/05/2015 Document Revised: 11/27/2015 Document Reviewed: 01/08/2015 Elsevier Interactive Patient Education  2017 [place] Prevention in the Home Falls can cause injuries. They can happen to people of all ages. There are many things you can do to make your home safe and to help prevent falls. What can I do on the outside of my home? Regularly fix the edges of walkways and driveways and fix any cracks. Remove anything that might make you trip as you walk through a door, such as a raised step or threshold. Trim any bushes or trees on the path to your home. Use bright outdoor lighting. Clear any walking paths of anything that might make someone trip, such as rocks or tools. Regularly check to see if handrails are loose or broken. Make sure that both sides of any steps have handrails. Any raised decks and porches should have guardrails on the edges. Have any leaves, snow, or ice cleared regularly. Use sand or salt on walking paths during winter. Clean up any spills in your garage right away. This includes oil or grease spills. What can I do in the bathroom? Use night lights. Install grab bars by the toilet and in the tub and shower. Do not use towel bars as grab bars. Use non-skid mats or decals in the tub or shower. If you need to sit down in the shower, use a plastic, non-slip stool. Keep the floor dry. Clean up any water that spills on the floor as soon as it happens. Remove soap buildup in the tub or shower regularly. Attach bath mats securely with double-sided non-slip rug tape. Do not have throw rugs and other things on the floor that can make you trip. What can I do in the bedroom? Use night lights. Make sure that you have a light by your bed that is easy to reach. Do not use any sheets or  blankets that are too big for your bed. They should not hang down onto the floor. Have a firm chair that has side arms. You can use this for support while you get dressed. Do not have throw rugs and other things on the floor that can make you trip. What can I do in the kitchen? Clean up any spills right away. Avoid walking on wet floors. Keep items that you use a lot in easy-to-reach places. If you need to reach something above you, use a strong step stool that has a grab bar. Keep electrical cords out of the way. Do not use floor polish or wax that makes floors slippery. If you must use wax, use non-skid floor wax. Do not have throw rugs and other things on the floor that can make you trip. What can I do with my stairs? Do not leave any items on the stairs. Make sure that there are handrails on both sides of the stairs and use them. Fix handrails that are broken or loose. Make sure that handrails are as long as the stairways. Check any carpeting to make sure that it is firmly attached to the stairs. Fix any carpet that is loose or worn. Avoid having throw rugs at the top or bottom of the stairs. If you do have throw rugs, attach them to the floor with carpet tape. Make sure that you  have a light switch at the top of the stairs and the bottom of the stairs. If you do not have them, ask someone to add them for you. What else can I do to help prevent falls? Wear shoes that: Do not have high heels. Have rubber bottoms. Are comfortable and fit you well. Are closed at the toe. Do not wear sandals. If you use a stepladder: Make sure that it is fully opened. Do not climb a closed stepladder. Make sure that both sides of the stepladder are locked into place. Ask someone to hold it for you, if possible. Clearly mark and make sure that you can see: Any grab bars or handrails. First and last steps. Where the edge of each step is. Use tools that help you move around (mobility aids) if they are  needed. These include: Canes. Walkers. Scooters. Crutches. Turn on the lights when you go into a dark area. Replace any light bulbs as soon as they burn out. Set up your furniture so you have a clear path. Avoid moving your furniture around. If any of your floors are uneven, fix them. If there are any pets around you, be aware of where they are. Review your medicines with your doctor. Some medicines can make you feel dizzy. This can increase your chance of falling. Ask your doctor what other things that you can do to help prevent falls. This information is not intended to replace advice given to you by your health care provider. Make sure you discuss any questions you have with your health care provider. Document Released: 01/03/2009 Document Revised: 08/15/2015 Document Reviewed: 04/13/2014 Elsevier Interactive Patient Education  2017 [employer].

## 2020-12-16 NOTE — Telephone Encounter (Signed)
Appt 11/19/20

## 2021-02-18 DIAGNOSIS — H18593 Other hereditary corneal dystrophies, bilateral: Secondary | ICD-10-CM | POA: Diagnosis not present

## 2021-03-02 ENCOUNTER — Encounter: Payer: Self-pay | Admitting: Adult Health

## 2021-03-04 NOTE — Telephone Encounter (Signed)
Please advise 

## 2021-03-05 DIAGNOSIS — J069 Acute upper respiratory infection, unspecified: Secondary | ICD-10-CM | POA: Diagnosis not present

## 2021-03-05 DIAGNOSIS — R001 Bradycardia, unspecified: Secondary | ICD-10-CM | POA: Diagnosis not present

## 2021-03-05 DIAGNOSIS — R051 Acute cough: Secondary | ICD-10-CM | POA: Diagnosis not present

## 2021-03-05 DIAGNOSIS — J029 Acute pharyngitis, unspecified: Secondary | ICD-10-CM | POA: Diagnosis not present

## 2021-03-07 ENCOUNTER — Encounter: Payer: Self-pay | Admitting: Adult Health

## 2021-03-07 ENCOUNTER — Ambulatory Visit (INDEPENDENT_AMBULATORY_CARE_PROVIDER_SITE_OTHER): Payer: Medicare Other | Admitting: Adult Health

## 2021-03-07 VITALS — BP 140/80 | HR 55 | Temp 98.6°F | Ht 71.25 in | Wt 221.0 lb

## 2021-03-07 DIAGNOSIS — R9431 Abnormal electrocardiogram [ECG] [EKG]: Secondary | ICD-10-CM | POA: Diagnosis not present

## 2021-03-07 NOTE — Progress Notes (Signed)
Subjective:    Patient ID: Jon Summers, male    DOB: [birth year], 75 y.o.   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  He presents to the office today for follow up regarding an abnormal EKG. He had family ( who work in the medical profession) over to his house and they did an EKG on him using a Kardia device. He reports that the readings were the same on two separate days, showing Bigeminy.   He was seen at Urgent Care two days ago and was diagnosed with a URI and prescribed Amoxicillin. They did an EKG at [facility] which showed Sinus Rhythm with premature ventricular complexes as a bigeminal rhythm with a rate of 40   Pulse Readings from Last 10 Encounters:  03/07/21 (!) 55  11/16/19 (!) 49  08/31/19 (!) 48  01/14/17 (!) 48  08/05/16 (!) 46  12/11/14 (!) 53   He denies CP or SOB at baseline. Will have mild shortness of breath with exercise. 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[place])    From medical records in [place] History   Socioeconomic History   Marital status: Married    Spouse name: Not on file   Number of children: Not on file   Years of education: Not on file   Highest education level: Not on file  Occupational History   Not on file  Tobacco Use   Smoking status: Former    Packs/day: 1.00    Years: 25.00    Pack years: 25.00    Types: Cigarettes    Quit date: 03/24/1987    Years since quitting: 33.9    Smokeless tobacco: Never  Vaping Use   Vaping Use: Never used  Substance and Sexual Activity   Alcohol use: Yes    Alcohol/week: 0.0 standard drinks    Comment: Socially; one drink per week.   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    Married   No biological children   Social Determinants of Radio broadcast assistant Strain: Low Risk    Difficulty of Paying Living Expenses: Not hard at all  Food Insecurity: No Food Insecurity   Worried About Charity fundraiser in the Last Year: Never true   Arboriculturist in the Last Year: Never true  Transportation Needs: No Transportation Needs   Lack of Transportation (Medical): No   Lack of Transportation (Non-Medical): No  Physical Activity: Sufficiently Active   Days of Exercise per Week: 5 days   Minutes of Exercise per Session: 30 min  Stress: No Stress Concern Present   Feeling of Stress : Not at all  Social Connections: Moderately Isolated   Frequency of Communication  with Friends and Family: Three times a week   Frequency of Social Gatherings with Friends and Family: Three times a week   Attends Religious Services: Never   Active Member of Clubs or Organizations: No   Attends Archivist Meetings: Never   Marital Status: Married  Human resources officer Violence: Not At Risk   Fear of Current or Ex-Partner: No   Emotionally Abused: No   Physically Abused: No   Sexually Abused: No    Past Surgical History:  Procedure Laterality Date   COLONOSCOPY     . 37 yrs ago   [place]    Family History  Problem Relation Age of Onset   Dementia Mother    59 / [place] Mother    Stroke Father        From dental visit   Prostate cancer Father    Cancer Father 20       prostate, treated with radiation   Lung cancer Paternal Uncle    Cancer Paternal Uncle         unknown/heavy smoker   Colon cancer Neg Hx    Colon polyps Neg Hx    Esophageal cancer Neg Hx    Rectal cancer Neg Hx    Stomach cancer Neg Hx     No Known Allergies  Current Outpatient Medications on File Prior to Visit  Medication Sig Dispense Refill   amoxicillin (AMOXIL) 500 MG capsule Take 500 mg by mouth 3 (three) times daily.     aspirin 81 MG tablet Take 81 mg by mouth daily.     benazepril-hydrochlorthiazide (LOTENSIN HCT) 20-25 MG tablet TAKE ONE TABLET BY MOUTH DAILY 90 tablet 1   DUREZOL 0.05 % EMUL Place 1 drop into the right eye 3 (three) times daily. (Patient not taking: No sig reported)     PROLENSA 0.07 % SOLN  (Patient not taking: No sig reported)     No current facility-administered medications on file prior to visit.    BP 140/80 (BP Location: Left Arm, Patient Position: Sitting, Cuff Size: Normal)    Pulse (!) 55    Temp 98.6 F (37 C) (Oral)    Ht 5' 11.25" (1.81 m)    Wt 221 lb (100.2 kg)    SpO2 97%    BMI 30.61 kg/m       Objective:   Physical Exam Vitals and nursing note reviewed.  Constitutional:      Appearance: Normal appearance.  Cardiovascular:     Rate and Rhythm: Regular rhythm. Bradycardia present.     Pulses: Normal pulses.     Heart sounds: Normal heart sounds.  Pulmonary:     Effort: Pulmonary effort is normal.     Breath sounds: Normal breath sounds.  Abdominal:     General: Abdomen is flat.     Palpations: Abdomen is soft.  Skin:    Capillary Refill: Capillary refill takes less than 2 seconds.  Neurological:     General: No focal deficit present.     Mental Status: He is alert and oriented to person, place, and time.  Psychiatric:        Mood and Affect: Mood normal.        Behavior: Behavior normal.        Thought Content: Thought content normal.        Judgment: Judgment normal.      Assessment & Plan:  1. Abnormal EKG  - EKG 12-Lead- Sinus  Bradycardia  -  frequent ectopic ventricular beat s  # VECs = 4 -Left atrial  enlargement.   -Nonspecific ST depression  -Nondiagnostic. Rate 55 - CBC with Differential/Platelet; Future - Basic Metabolic Panel; Future - Magnesium; Future - TSH; Future - TSH - Magnesium - Basic Metabolic Panel - CBC with Differential/Platelet - Ambulatory referral to Cardiology  Dorothyann Peng, NP

## 2021-03-08 LAB — CBC WITH DIFFERENTIAL/PLATELET
Absolute Monocytes: 515 cells/uL (ref 200–950)
Basophils Absolute: 51 cells/uL (ref 0–200)
Basophils Relative: 0.5 %
Eosinophils Absolute: 273 cells/uL (ref 15–500)
Eosinophils Relative: 2.7 %
HCT: 41.8 % (ref 38.5–50.0)
Hemoglobin: 13.9 g/dL (ref 13.2–17.1)
Lymphs Abs: 2182 cells/uL (ref 850–3900)
MCH: 31.1 pg (ref 27.0–33.0)
MCHC: 33.3 g/dL (ref 32.0–36.0)
MCV: 93.5 fL (ref 80.0–100.0)
MPV: 10.8 fL (ref 7.5–12.5)
Monocytes Relative: 5.1 %
Neutro Abs: 7080 cells/uL (ref 1500–7800)
Neutrophils Relative %: 70.1 %
Platelets: 282 10*3/uL (ref 140–400)
RBC: 4.47 10*6/uL (ref 4.20–5.80)
RDW: 12.5 % (ref 11.0–15.0)
Total Lymphocyte: 21.6 %
WBC: 10.1 10*3/uL (ref 3.8–10.8)

## 2021-03-08 LAB — BASIC METABOLIC PANEL
BUN: 22 mg/dL (ref 7–25)
CO2: 26 mmol/L (ref 20–32)
Calcium: 9 mg/dL (ref 8.6–10.3)
Chloride: 105 mmol/L (ref 98–110)
Creat: 1.25 mg/dL (ref 0.70–1.28)
Glucose, Bld: 79 mg/dL (ref 65–99)
Potassium: 4.1 mmol/L (ref 3.5–5.3)
Sodium: 142 mmol/L (ref 135–146)

## 2021-03-08 LAB — TSH: TSH: 0.84 mIU/L (ref 0.40–4.50)

## 2021-03-08 LAB — MAGNESIUM: Magnesium: 2.1 mg/dL (ref 1.5–2.5)

## 2021-04-11 NOTE — Progress Notes (Signed)
Cardiology Office Note:   Date:  04/14/2021  NAME:  Jon Summers    MRN: [id number] DOB:  Feb 27, 1946   PCP:  Dorothyann Peng, NP  Cardiologist:  None  Electrophysiologist:  None   Referring MD: Dorothyann Peng, NP   Chief Complaint  Patient presents with   pvc   History of Present Illness:   Jon Summers is a 76 y.o. male with a hx of HTN, HLD who is being seen today for the evaluation of PVCs at the request of Dorothyann Peng, NP. Seen by PCP for abnormal EKG. PVCs seen which appear to be outflow tract PVCs. Referred for evaluation.   He reports he was with his family several months ago and they were testing a new blood pressure cuff.  He reports that he was unable to get a good reading as his blood pressure was low.  He had no symptoms of chest pain or trouble breathing but just had low blood pressure.  He used a cardia mobile device which she is brother-in-law has.  Apparently he was noted to be in ventricular bigeminy.  His sister is a cardiac nurse and interpreted the EKG.  He was then evaluated by his primary care physician and that EKG also demonstrated PVCs.  He reports he does not feel them.  He has no chest pain or trouble breathing.  He reports no pounding sensation or sensation of palpitations.  He has no limitations.  He does not exercise routinely but does get around very easily.  He does report a recent cold and upper respiratory infection.  Suspect this could have been a trigger.  Recent lab work by his primary care physician demonstrates normal potassium and normal thyroid function.  EKG today demonstrates sinus bradycardia heart rate 53.  He is not on a beta-blocker.  He denies any major symptoms in office today.  He is a former smoker but quit several years ago.  No alcohol or drug use is reported.  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  He is married.  No children.  Denies any symptoms in office today.  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 10 yrs ago   HERNIA REPAIR  1994    Current Medications: Current Meds  Medication Sig   aspirin 81 MG tablet Take 81 mg by mouth daily.   benazepril-hydrochlorthiazide (LOTENSIN HCT) 20-25 MG tablet TAKE ONE TABLET BY MOUTH DAILY     Allergies:    Patient has no known allergies.   Social History: Social History   Socioeconomic History   Marital status: Married    Spouse name: Not on file   Number of children: 0   Years of education: Not on file   Highest education level: Not on file  Occupational History   Occupation: Part time transportation  Tobacco Use   Smoking status: Former    Packs/day: 1.00    Years:  25.00    Pack years: 25.00    Types: Cigarettes    Quit date: 03/24/1987    Years since quitting: 34.0   Smokeless tobacco: Never  Vaping Use   Vaping Use: Never used  Substance and Sexual Activity   Alcohol use: Yes    Alcohol/week: 0.0 standard drinks    Comment: Socially; one drink per week.   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    Married   No biological children   Social Determinants of Radio broadcast assistant Strain: Low Risk    Difficulty of Paying Living Expenses: Not hard at all  Food Insecurity: No Food Insecurity   Worried About Charity fundraiser in the Last Year: Never true   Arboriculturist in the Last Year: Never true  Transportation Needs: No  Transportation Needs   Lack of Transportation (Medical): No   Lack of Transportation (Non-Medical): No  Physical Activity: Sufficiently Active   Days of Exercise per Week: 5 days   Minutes of Exercise per Session: 30 min  Stress: No Stress Concern Present   Feeling of Stress : Not at all  Social Connections: Moderately Isolated   Frequency of Communication with Friends and Family: Three times a week   Frequency of Social Gatherings with Friends and Family: Three times a week   Attends Religious Services: Never   Active Member of Clubs or Organizations: No   Attends Archivist Meetings: Never   Marital Status: Married     Family History: The patient's family history includes Cancer in his paternal uncle; Cancer (age of onset: 52) in his father; Dementia in his mother; Lung cancer in his paternal uncle; Miscarriages / [place] in his mother; Prostate cancer in his father; Stroke in his father. There is no history of Colon cancer, Colon polyps, Esophageal cancer, Rectal cancer, or Stomach cancer.  ROS:   All other ROS reviewed and negative. Pertinent positives noted in the HPI.     EKGs/Labs/Other Studies Reviewed:   The following studies were personally reviewed by me today:  EKG:  EKG is ordered today.  The ekg ordered today demonstrates sinus bradycardia heart rate 53, no acute ischemic changes or evidence of infarction, and was personally reviewed by me.   Recent Labs: 03/07/2021: BUN 22; Creat 1.25; Hemoglobin 13.9; Magnesium 2.1; Platelets 282; Potassium 4.1; Sodium 142; TSH 0.84   Recent Lipid Panel    Component Value Date/Time   CHOL 160 11/16/2019 0830   TRIG 50 11/16/2019 0830   HDL 46 11/16/2019 0830   CHOLHDL 3.5 11/16/2019 0830   VLDL 10.0 02/23/2019 0739   LDLCALC 101 (H) 11/16/2019 0830    Physical Exam:   VS:  BP 134/66 (BP Location: Left Arm, Patient Position: Sitting, Cuff Size: Normal)    Pulse (!) 53    Ht 5\' 11"  (1.803 m)    Wt 222 lb (100.7  kg)    BMI 30.96 kg/m    Wt Readings from Last 3 Encounters:  04/14/21 222 lb (100.7 kg)  03/07/21 221 lb (100.2 kg)  11/16/19 210 lb 3.2 oz (95.3 kg)    General: Well nourished, well developed, in no acute distress Head: Atraumatic, normal size  Eyes: PEERLA, EOMI  Neck: Supple, no JVD Endocrine: No thryomegaly Cardiac: Normal S1, S2; RRR;  no murmurs, rubs, or gallops Lungs: Clear to auscultation bilaterally, no wheezing, rhonchi or rales  Abd: Soft, nontender, no hepatomegaly  Ext: No edema, pulses 2+ Musculoskeletal: No deformities, BUE and BLE strength normal and equal Skin: Warm and dry, no rashes   Neuro: Alert and oriented to person, place, time, and situation, CNII-XII grossly intact, no focal deficits  Psych: Normal mood and affect   ASSESSMENT:   Jon Summers is a 76 y.o. male who presents for the following: 1. PVC (premature ventricular contraction)   2. Nonspecific abnormal electrocardiogram (ECG) (EKG)     PLAN:   1. PVC (premature ventricular contraction) 2. Nonspecific abnormal electrocardiogram (ECG) (EKG) -PVCs captured on EKG when evaluated by primary care physician.  No symptoms from this.  Recent lab work shows normal potassium and normal thyroid function.  He has no limitations to suggest congestive heart failure.  His EKG in office is normal today.  We discussed that PVCs are normal.  The only reason to treat him would be symptoms or impact on heart function.  He has no symptoms of congestive heart failure today.  I think a reasonable thing to do would be to pursue an echocardiogram.  This will let us know if he has any evidence of LV dysfunction.  In the absence of LV dysfunction and symptoms there is no need to treat these.  These are benign.  We will start with an echocardiogram.  We will see him back as needed.  If his function is normal no treatment or further evaluation will be needed.  Disposition: Return if symptoms worsen or fail to  improve.  Medication Adjustments/Labs and Tests Ordered: Current medicines are reviewed at length with the patient today.  Concerns regarding medicines are outlined above.  Orders Placed This Encounter  Procedures   EKG 12-Lead   ECHOCARDIOGRAM COMPLETE   No orders of the defined types were placed in this encounter.   Patient Instructions  Medication Instructions:  The current medical regimen is effective;  continue present plan and medications.  *If you need a refill on your cardiac medications before your next appointment, please call your pharmacy*   Testing/Procedures: Echocardiogram - Your physician has requested that you have an echocardiogram. Echocardiography is a painless test that uses sound waves to create images of your heart. It provides your doctor with information about the size and shape of your heart and how well your hearts chambers and valves are working. This procedure takes approximately one hour. There are no restrictions for this procedure. This will be performed at either our [facility] location - [address], [place] location [facility] 2nd floor.    Follow-Up: At [facility], you and your health needs are our priority.  As part of our continuing mission to provide you with exceptional heart care, we have created designated Provider Care Teams.  These Care Teams include your primary Cardiologist (physician) and Advanced Practice Providers (APPs -  Physician Assistants and Nurse Practitioners) who all work together to provide you with the care you need, when you need it.  We recommend signing up for the patient portal called "MyChart".  Sign up information is provided on this After Visit Summary.  MyChart is used to connect with patients for Virtual Visits (Telemedicine).  Patients are able to view lab/test results, encounter notes, upcoming appointments, etc.  Non-urgent messages can be sent to your provider as well.   To  learn more about what  you can do with MyChart, go to [email or web address].    Your next appointment:   As needed  The format for your next appointment:   In Person  Provider:   Eleonore Chiquito, MD       Signed, Addison Naegeli. Audie Box, MD, [place]  [address] [place], [place] [address] [phone]  04/14/2021 8:52 AM

## 2021-04-14 ENCOUNTER — Encounter: Payer: Self-pay | Admitting: Cardiovascular Disease

## 2021-04-14 ENCOUNTER — Other Ambulatory Visit: Payer: Self-pay

## 2021-04-14 ENCOUNTER — Ambulatory Visit: Payer: Medicare Other | Admitting: Cardiovascular Disease

## 2021-04-14 ENCOUNTER — Ambulatory Visit (HOSPITAL_BASED_OUTPATIENT_CLINIC_OR_DEPARTMENT_OTHER): Payer: Medicare Other | Admitting: Cardiology

## 2021-04-14 VITALS — BP 134/66 | HR 53 | Ht 71.0 in | Wt 222.0 lb

## 2021-04-14 DIAGNOSIS — R9431 Abnormal electrocardiogram [ECG] [EKG]: Secondary | ICD-10-CM | POA: Diagnosis not present

## 2021-04-14 DIAGNOSIS — I493 Ventricular premature depolarization: Secondary | ICD-10-CM

## 2021-04-14 NOTE — Patient Instructions (Signed)
Medication Instructions:  The current medical regimen is effective;  continue present plan and medications.  *If you need a refill on your cardiac medications before your next appointment, please call your pharmacy*   Testing/Procedures: Echocardiogram - Your physician has requested that you have an echocardiogram. Echocardiography is a painless test that uses sound waves to create images of your heart. It provides your doctor with information about the size and shape of your heart and how well your heart's chambers and valves are working. This procedure takes approximately one hour. There are no restrictions for this procedure. This will be performed at either our [address] location - [address], [address]  -or- [place] location [address] 2nd floor.    Follow-Up: At [facility], you and your health needs are our priority.  As part of our continuing mission to provide you with exceptional heart care, we have created designated Provider Care Teams.  These Care Teams include your primary Cardiologist (physician) and Advanced Practice Providers (APPs -  Physician Assistants and Nurse Practitioners) who all work together to provide you with the care you need, when you need it.  We recommend signing up for the patient portal called "MyChart".  Sign up information is provided on this After Visit Summary.  MyChart is used to connect with patients for Virtual Visits (Telemedicine).  Patients are able to view lab/test results, encounter notes, upcoming appointments, etc.  Non-urgent messages can be sent to your provider as well.   To learn more about what you can do with MyChart, go to [email or web address].    Your next appointment:   As needed  The format for your next appointment:   In Person  Provider:   [place] O'Neal, MD     

## 2021-04-22 ENCOUNTER — Ambulatory Visit (HOSPITAL_COMMUNITY): Payer: Medicare Other | Attending: Cardiology

## 2021-04-22 ENCOUNTER — Other Ambulatory Visit: Payer: Self-pay

## 2021-04-22 DIAGNOSIS — I351 Nonrheumatic aortic (valve) insufficiency: Secondary | ICD-10-CM | POA: Insufficient documentation

## 2021-04-22 DIAGNOSIS — E785 Hyperlipidemia, unspecified: Secondary | ICD-10-CM | POA: Insufficient documentation

## 2021-04-22 DIAGNOSIS — Z87891 Personal history of nicotine dependence: Secondary | ICD-10-CM | POA: Insufficient documentation

## 2021-04-22 DIAGNOSIS — I2510 Atherosclerotic heart disease of native coronary artery without angina pectoris: Secondary | ICD-10-CM | POA: Insufficient documentation

## 2021-04-22 DIAGNOSIS — R9431 Abnormal electrocardiogram [ECG] [EKG]: Secondary | ICD-10-CM | POA: Diagnosis not present

## 2021-04-22 DIAGNOSIS — I10 Essential (primary) hypertension: Secondary | ICD-10-CM | POA: Diagnosis not present

## 2021-04-22 DIAGNOSIS — I493 Ventricular premature depolarization: Secondary | ICD-10-CM | POA: Diagnosis not present

## 2021-04-23 LAB — ECHOCARDIOGRAM COMPLETE
Area-P 1/2: 3.18 cm2
S' Lateral: 2.9 cm

## 2021-05-22 ENCOUNTER — Other Ambulatory Visit: Payer: Self-pay | Admitting: Adult Health

## 2021-05-22 DIAGNOSIS — I10 Essential (primary) hypertension: Secondary | ICD-10-CM

## 2021-10-28 ENCOUNTER — Telehealth: Payer: Self-pay | Admitting: Adult Health

## 2021-10-28 NOTE — Telephone Encounter (Signed)
Requesting refill benazepril-hydrochlorthiazide (LOTENSIN HCT) 20-25 MG tablet   [facility] [id number] - [place], [place] Phone:  [phone]  Fax:  [phone]

## 2021-10-29 NOTE — Telephone Encounter (Signed)
Patient need to schedule an ov for more refills. 

## 2021-10-29 NOTE — Telephone Encounter (Signed)
LM for patient to call and schedule OV

## 2021-11-14 ENCOUNTER — Encounter: Payer: Self-pay | Admitting: Adult Health

## 2021-11-14 ENCOUNTER — Ambulatory Visit (INDEPENDENT_AMBULATORY_CARE_PROVIDER_SITE_OTHER): Payer: Medicare Other | Admitting: Adult Health

## 2021-11-14 VITALS — BP 124/78 | HR 50 | Temp 98.6°F | Ht 71.0 in | Wt 215.0 lb

## 2021-11-14 DIAGNOSIS — Z0000 Encounter for general adult medical examination without abnormal findings: Secondary | ICD-10-CM | POA: Diagnosis not present

## 2021-11-14 DIAGNOSIS — I10 Essential (primary) hypertension: Secondary | ICD-10-CM

## 2021-11-14 DIAGNOSIS — Z8546 Personal history of malignant neoplasm of prostate: Secondary | ICD-10-CM | POA: Diagnosis not present

## 2021-11-14 DIAGNOSIS — E785 Hyperlipidemia, unspecified: Secondary | ICD-10-CM

## 2021-11-14 LAB — CBC WITH DIFFERENTIAL/PLATELET
Basophils Absolute: 0 10*3/uL (ref 0.0–0.1)
Basophils Relative: 0.5 % (ref 0.0–3.0)
Eosinophils Absolute: 0.2 10*3/uL (ref 0.0–0.7)
Eosinophils Relative: 3 % (ref 0.0–5.0)
HCT: 40.3 % (ref 39.0–52.0)
Hemoglobin: 13.5 g/dL (ref 13.0–17.0)
Lymphocytes Relative: 24.9 % (ref 12.0–46.0)
Lymphs Abs: 1.7 10*3/uL (ref 0.7–4.0)
MCHC: 33.6 g/dL (ref 30.0–36.0)
MCV: 93.2 fl (ref 78.0–100.0)
Monocytes Absolute: 0.4 10*3/uL (ref 0.1–1.0)
Monocytes Relative: 6.2 % (ref 3.0–12.0)
Neutro Abs: 4.5 10*3/uL (ref 1.4–7.7)
Neutrophils Relative %: 65.4 % (ref 43.0–77.0)
Platelets: 248 10*3/uL (ref 150.0–400.0)
RBC: 4.33 Mil/uL (ref 4.22–5.81)
RDW: 13.8 % (ref 11.5–15.5)
WBC: 6.9 10*3/uL (ref 4.0–10.5)

## 2021-11-14 LAB — COMPREHENSIVE METABOLIC PANEL
ALT: 11 U/L (ref 0–53)
AST: 12 U/L (ref 0–37)
Albumin: 4.3 g/dL (ref 3.5–5.2)
Alkaline Phosphatase: 69 U/L (ref 39–117)
BUN: 21 mg/dL (ref 6–23)
CO2: 30 mEq/L (ref 19–32)
Calcium: 9.1 mg/dL (ref 8.4–10.5)
Chloride: 100 mEq/L (ref 96–112)
Creatinine, Ser: 1.03 mg/dL (ref 0.40–1.50)
GFR: 71.88 mL/min (ref >60.00)
Glucose, Bld: 90 mg/dL (ref 70–99)
Potassium: 4.6 mEq/L (ref 3.5–5.1)
Sodium: 140 mEq/L (ref 135–145)
Total Bilirubin: 1.4 mg/dL — ABNORMAL HIGH (ref 0.2–1.2)
Total Protein: 6.8 g/dL (ref 6.0–8.3)

## 2021-11-14 LAB — PSA: PSA: 0.54 ng/mL (ref 0.10–4.00)

## 2021-11-14 LAB — LIPID PANEL
Cholesterol: 156 mg/dL (ref 0–200)
HDL: 41.2 mg/dL (ref >39.00)
LDL Cholesterol: 106 mg/dL — ABNORMAL HIGH (ref 0–99)
NonHDL: 114.76
Total CHOL/HDL Ratio: 4
Triglycerides: 45 mg/dL (ref 0.0–149.0)
VLDL: 9 mg/dL (ref 0.0–40.0)

## 2021-11-14 LAB — TSH: TSH: 0.58 u[IU]/mL (ref 0.35–5.50)

## 2021-11-14 MED ORDER — BENAZEPRIL-HYDROCHLOROTHIAZIDE 20-25 MG PO TABS: 1.0000 | ORAL_TABLET | Freq: Every day | ORAL | 3 refills | Status: DC

## 2021-11-14 NOTE — Progress Notes (Signed)
Subjective:    Patient ID: Jon Summers, male    DOB: Jul 09, 1945, 76 y.o.   MRN: [id number]  HPI Patient presents for yearly preventative medicine examination. 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  Hypertension - managed with Lotensin HCT 20-25 mg. He denies dizziness, lightheaded, or blurred vision   BP Readings from Last 3 Encounters:  11/14/21 124/78  04/14/21 134/66  03/07/21 140/80   Hyperlipidemia - mildly elevated LDL on last lipid panel. Not on any medications  Lab Results  Component Value Date   CHOL 160 11/16/2019   HDL 46 11/16/2019   LDLCALC 101 (H) 11/16/2019   TRIG 50 11/16/2019   CHOLHDL 3.5 11/16/2019   History of Prostate Cancer - treated in 2018 with radiation therapy. He does not see urology any longer Lab Results  Component Value Date   PSA 0.4 11/16/2019   PSA 0.61 11/15/2018   PSA 1.06 07/29/2017    All immunizations and health maintenance protocols were reviewed with the patient and needed orders were placed.  Appropriate screening laboratory values were ordered for the patient including screening of hyperlipidemia, renal function and hepatic function. If indicated by BPH, a PSA was ordered.  Medication reconciliation,  past medical history, social history, problem list and allergies were reviewed in detail with the patient  Goals were established with regard to weight loss, exercise, and  diet in compliance with medications.  He has started doing weight watchers and tries to exercise on a routine basis.  Wt Readings from Last 3 Encounters:  11/14/21 215 lb (97.5 kg)  04/14/21 222 lb (100.7 kg)  03/07/21 221 lb (100.2 kg)   He is up to date on routine colon cancer screening   He has no acute complaints   Review of Systems   Constitutional: Negative.   HENT: Negative.    Eyes: Negative.   Respiratory: Negative.    Cardiovascular: Negative.   Gastrointestinal: Negative.   Endocrine: Negative.   Genitourinary: Negative.   Musculoskeletal: Negative.   Skin: Negative.   Allergic/Immunologic: Negative.   Neurological: Negative.   Hematological: Negative.   Psychiatric/Behavioral: Negative.    All other systems reviewed and are negative.  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[place])    From medical records in [place] History   Socioeconomic History   Marital status: Married    Spouse name: Not on file   Number of children: 0   Years of education: Not on file   Highest education level: Not on file  Occupational History   Occupation: Part time transportation  Tobacco Use   Smoking status: Former    Packs/day: 1.00    Years: 25.00    Total pack years: 25.00    Types: Cigarettes    Quit date: 03/24/1987    Years since quitting: 34.6   Smokeless tobacco: Never  Vaping Use   Vaping Use: Never used  Substance and Sexual Activity  Alcohol use: Yes    Alcohol/week: 0.0 standard drinks of alcohol    Comment: Socially; one drink per week.   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    Married   No biological children   Social Determinants of Health   Financial Resource Strain: Low Risk  (11/10/2021)   Overall Financial Resource Strain (CARDIA)    Difficulty of Paying Living Expenses: Not hard at all  Food Insecurity: No Food Insecurity (11/10/2021)   Hunger Vital Sign    Worried About  Running Out of Food in the Last Year: Never true    Ran Out of Food in the Last Year: Never true  Transportation Needs: No Transportation Needs (11/10/2021)   PRAPARE - Hydrologist (Medical): No    Lack of Transportation (Non-Medical): No  Physical Activity: Sufficiently Active (11/10/2021)   Exercise Vital Sign    Days of Exercise per Week: 2 days    Minutes of Exercise per Session: 110 min  Stress: No Stress Concern Present (11/10/2021)   [place]    Feeling of Stress : Not at all  Social Connections: Unknown (11/10/2021)   Social Connection and Isolation Panel [NHANES]    Frequency of Communication with Friends and Family: Patient refused    Frequency of Social Gatherings with Friends and Family: Patient refused    Attends Religious Services: Patient refused    Marine scientist or Organizations: Patient refused    Attends Archivist Meetings: Never    Marital Status: Married  Human resources officer Violence: Not At Risk (11/19/2020)   Humiliation, Afraid, Rape, and Kick questionnaire    Fear of Current or Ex-Partner: No    Emotionally Abused: No    Physically Abused: No    Sexually Abused: No    Past Surgical History:  Procedure Laterality Date   COLONOSCOPY     . 82 yrs ago   [place]    Family History  Problem Relation Age of Onset   Dementia Mother    69 / [place] Mother    Stroke Father        From dental visit   Prostate cancer Father    Cancer Father 33       prostate, treated with radiation   Lung cancer Paternal Uncle    Cancer Paternal Uncle        unknown/heavy smoker   Colon cancer Neg Hx    Colon polyps Neg Hx    Esophageal cancer Neg Hx    Rectal cancer Neg Hx    Stomach cancer Neg Hx     No Known Allergies  Current Outpatient Medications on File Prior to Visit  Medication Sig Dispense Refill   aspirin 81 MG tablet  Take 81 mg by mouth daily.     No current facility-administered medications on file prior to visit.    BP 124/78   Pulse (!) 50   Temp 98.6 F (37 C) (Oral)   Ht '5\' 11"'$  (1.803 m)   Wt 215 lb (97.5 kg)   SpO2 98%   BMI 29.99 kg/m       Objective:   Physical Exam Vitals and nursing note reviewed.  Constitutional:      General: He is not  in acute distress.    Appearance: Normal appearance. He is well-developed. He is obese.  HENT:     Head: Normocephalic and atraumatic.     Right Ear: Tympanic membrane, ear canal and external ear normal. There is no impacted cerumen.     Left Ear: Tympanic membrane, ear canal and external ear normal. There is no impacted cerumen.     Nose: Nose normal. No congestion or rhinorrhea.     Mouth/Throat:     Mouth: Mucous membranes are moist.     Pharynx: Oropharynx is clear. No oropharyngeal exudate or posterior oropharyngeal erythema.  Eyes:     General:        Right eye: No discharge.        Left eye: No discharge.     Extraocular Movements: Extraocular movements intact.     Conjunctiva/sclera: Conjunctivae normal.     Pupils: Pupils are equal, round, and reactive to light.  Neck:     Vascular: No carotid bruit.     Trachea: No tracheal deviation.  Cardiovascular:     Rate and Rhythm: Normal rate and regular rhythm.     Pulses: Normal pulses.     Heart sounds: Normal heart sounds. No murmur heard.    No friction rub. No gallop.  Pulmonary:     Effort: Pulmonary effort is normal. No respiratory distress.     Breath sounds: Normal breath sounds. No stridor. No wheezing, rhonchi or rales.  Chest:     Chest wall: No tenderness.  Abdominal:     General: Bowel sounds are normal. There is no distension.     Palpations: Abdomen is soft. There is no mass.     Tenderness: There is no abdominal tenderness. There is no right CVA tenderness, left CVA tenderness, guarding or rebound.     Hernia: No hernia is present.  Musculoskeletal:         General: No swelling, tenderness, deformity or signs of injury. Normal range of motion.     Right lower leg: No edema.     Left lower leg: No edema.  Lymphadenopathy:     Cervical: No cervical adenopathy.  Skin:    General: Skin is warm and dry.     Capillary Refill: Capillary refill takes less than 2 seconds.     Coloration: Skin is not jaundiced or pale.     Findings: No bruising, erythema, lesion or rash.  Neurological:     General: No focal deficit present.     Mental Status: He is alert and oriented to person, place, and time.     Cranial Nerves: No cranial nerve deficit.     Sensory: No sensory deficit.     Motor: No weakness.     Coordination: Coordination normal.     Gait: Gait normal.     Deep Tendon Reflexes: Reflexes normal.  Psychiatric:        Mood and Affect: Mood normal.        Behavior: Behavior normal.        Thought Content: Thought content normal.        Judgment: Judgment normal.       Assessment & Plan:   1. Routine general medical examination at a health care facility - Encouraged more aerobic exercise - Follow up in one year or sooner if needed - CBC with Differential/Platelet - Comprehensive metabolic panel - Lipid panel - TSH  2. History of prostate cancer  - PSA  3. Essential hypertension - Well  controlled. No change in medication  - CBC with Differential/Platelet - Comprehensive metabolic panel - Lipid panel - TSH - benazepril-hydrochlorthiazide (LOTENSIN HCT) 20-25 MG tablet; Take 1 tablet by mouth daily.  Dispense: 90 tablet; Refill: 3  4. Hyperlipidemia, unspecified hyperlipidemia type - Consider statin  - CBC with Differential/Platelet - Comprehensive metabolic panel - Lipid panel - TSH  Dorothyann Peng, NP

## 2021-11-14 NOTE — Patient Instructions (Signed)
It was great seeing you today   We will follow up with you regarding your lab work   Please let me know if you need anything   

## 2021-11-20 ENCOUNTER — Telehealth: Payer: Self-pay | Admitting: Adult Health

## 2021-11-20 NOTE — Telephone Encounter (Signed)
Left message for patient to call back and schedule Medicare Annual Wellness Visit (AWV) either virtually or in office. Left  my Herbie Drape number [phone]   Last AWV8/30/22  ; please schedule at anytime with [facility] Nurse Health Advisor 1 or 2

## 2021-12-25 ENCOUNTER — Telehealth: Payer: Self-pay

## 2021-12-25 NOTE — Telephone Encounter (Signed)
Contacted patient on preferred number listed in notes for scheduled AWV. Patient stated refusal to complete this visit.

## 2022-04-02 DIAGNOSIS — K080 Exfoliation of teeth due to systemic causes: Secondary | ICD-10-CM | POA: Diagnosis not present

## 2022-05-12 DIAGNOSIS — H5712 Ocular pain, left eye: Secondary | ICD-10-CM | POA: Diagnosis not present

## 2022-09-08 DIAGNOSIS — H43813 Vitreous degeneration, bilateral: Secondary | ICD-10-CM | POA: Diagnosis not present

## 2022-12-14 ENCOUNTER — Other Ambulatory Visit: Payer: Self-pay | Admitting: Adult Health

## 2022-12-14 DIAGNOSIS — I10 Essential (primary) hypertension: Secondary | ICD-10-CM

## 2022-12-15 NOTE — Telephone Encounter (Signed)
Patient need to schedule cpe  for more refills.

## 2022-12-16 NOTE — Telephone Encounter (Signed)
Pt has sch cpe for 02-11-2023 and would like enough refills on bp med

## 2022-12-18 NOTE — Telephone Encounter (Signed)
Pt checking on progress of this refill, he is leaving town next week.

## 2022-12-25 MED ORDER — BENAZEPRIL-HYDROCHLOROTHIAZIDE 20-25 MG PO TABS: 1.0000 | ORAL_TABLET | Freq: Every day | ORAL | 0 refills | Status: DC

## 2022-12-25 NOTE — Addendum Note (Signed)
Addended by: Waymon Amato R on: 12/25/2022 02:42 PM   Modules accepted: Orders

## 2022-12-25 NOTE — Telephone Encounter (Signed)
Pt checking on progress of this refill, says she is now out of town and needs his meds sent to  [id number] [place] - [place], [place] - [address] AT [address] Phone: [phone]  Fax: [phone]

## 2022-12-25 NOTE — Telephone Encounter (Signed)
Rx refilled.

## 2023-02-11 ENCOUNTER — Encounter: Payer: Self-pay | Admitting: Adult Health

## 2023-02-11 ENCOUNTER — Telehealth: Payer: Self-pay | Admitting: Adult Health

## 2023-02-11 ENCOUNTER — Ambulatory Visit: Payer: Medicare Other | Admitting: Adult Health

## 2023-02-11 VITALS — BP 124/80 | HR 46 | Temp 97.7°F | Ht 70.75 in | Wt 211.0 lb

## 2023-02-11 DIAGNOSIS — Z23 Encounter for immunization: Secondary | ICD-10-CM

## 2023-02-11 DIAGNOSIS — I10 Essential (primary) hypertension: Secondary | ICD-10-CM

## 2023-02-11 DIAGNOSIS — Z8546 Personal history of malignant neoplasm of prostate: Secondary | ICD-10-CM

## 2023-02-11 DIAGNOSIS — E785 Hyperlipidemia, unspecified: Secondary | ICD-10-CM | POA: Diagnosis not present

## 2023-02-11 DIAGNOSIS — Z0000 Encounter for general adult medical examination without abnormal findings: Secondary | ICD-10-CM

## 2023-02-11 LAB — COMPREHENSIVE METABOLIC PANEL
ALT: 11 U/L (ref 0–53)
AST: 13 U/L (ref 0–37)
Albumin: 4.2 g/dL (ref 3.5–5.2)
Alkaline Phosphatase: 70 U/L (ref 39–117)
BUN: 18 mg/dL (ref 6–23)
CO2: 30 meq/L (ref 19–32)
Calcium: 8.9 mg/dL (ref 8.4–10.5)
Chloride: 103 meq/L (ref 96–112)
Creatinine, Ser: 0.95 mg/dL (ref 0.40–1.50)
GFR: 78.52 mL/min (ref >60.00)
Glucose, Bld: 91 mg/dL (ref 70–99)
Potassium: 4.2 meq/L (ref 3.5–5.1)
Sodium: 139 meq/L (ref 135–145)
Total Bilirubin: 1.3 mg/dL — ABNORMAL HIGH (ref 0.2–1.2)
Total Protein: 6.5 g/dL (ref 6.0–8.3)

## 2023-02-11 LAB — CBC
HCT: 40.9 % (ref 39.0–52.0)
Hemoglobin: 13.5 g/dL (ref 13.0–17.0)
MCHC: 32.9 g/dL (ref 30.0–36.0)
MCV: 95.3 fL (ref 78.0–100.0)
Platelets: 238 10*3/uL (ref 150.0–400.0)
RBC: 4.29 Mil/uL (ref 4.22–5.81)
RDW: 14 % (ref 11.5–15.5)
WBC: 6.5 10*3/uL (ref 4.0–10.5)

## 2023-02-11 LAB — TSH: TSH: 0.79 u[IU]/mL (ref 0.35–5.50)

## 2023-02-11 LAB — LIPID PANEL
Cholesterol: 177 mg/dL (ref 0–200)
HDL: 44 mg/dL (ref >39.00)
LDL Cholesterol: 122 mg/dL — ABNORMAL HIGH (ref 0–99)
NonHDL: 133.38
Total CHOL/HDL Ratio: 4
Triglycerides: 55 mg/dL (ref 0.0–149.0)
VLDL: 11 mg/dL (ref 0.0–40.0)

## 2023-02-11 LAB — PSA: PSA: 0.51 ng/mL (ref 0.10–4.00)

## 2023-02-11 MED ORDER — BENAZEPRIL-HYDROCHLOROTHIAZIDE 20-25 MG PO TABS: 1.0000 | ORAL_TABLET | Freq: Every day | ORAL | 3 refills | Status: DC

## 2023-02-11 MED ORDER — ROSUVASTATIN CALCIUM 10 MG PO TABS: 10.0000 mg | ORAL_TABLET | Freq: Every day | ORAL | 3 refills | Status: DC

## 2023-02-11 NOTE — Progress Notes (Signed)
Subjective:    Patient ID: Jon Summers, male    DOB: 03/19/1946, 77 y.o.   MRN: [id number]  HPI Patient presents for yearly preventative medicine examination. He is a pleasant 77 year old male who  has a past medical history of Allergy, CAD (coronary artery disease), Chicken pox, Chronic kidney disease, DJD (degenerative joint disease), Hyperlipidemia, Hypertension, Prostate cancer (HCC), and Tortuous aorta (HCC).  Hypertension - managed with Lotensin HCT 20-25 mg. He denies dizziness, lightheaded, or blurred vision  BP Readings from Last 3 Encounters:  02/11/23 (!) 150/60  11/14/21 124/78  04/14/21 134/66   Hyperlipidemia - mildly elevated LDL on last lipid panel. Not on any medications. Lab Results  Component Value Date   CHOL 156 11/14/2021   HDL 41.20 11/14/2021   LDLCALC 106 (H) 11/14/2021   TRIG 45.0 11/14/2021   CHOLHDL 4 11/14/2021   History of Prostate Cancer - treated in 2018 with radiation therapy. He does not see urology any longer Lab Results  Component Value Date   PSA 0.54 11/14/2021   PSA 0.4 11/16/2019   PSA 0.61 11/15/2018   All immunizations and health maintenance protocols were reviewed with the patient and needed orders were placed.  Appropriate screening laboratory values were ordered for the patient including screening of hyperlipidemia, renal function and hepatic function. If indicated by BPH, a PSA was ordered.  Medication reconciliation,  past medical history, social history, problem list and allergies were reviewed in detail with the patient  Goals were established with regard to weight loss, exercise, and  diet in compliance with medications. He is trying to lose weight he has cut back about 40% on the amount he is eating and is trying to eat healthier.  Wt Readings from Last 3 Encounters:  02/11/23 211 lb (95.7 kg)  11/14/21 215 lb (97.5 kg)  04/14/21 222 lb (100.7 kg)    Review of Systems  Constitutional: Negative.   HENT: Negative.     Eyes: Negative.   Respiratory: Negative.    Cardiovascular: Negative.   Gastrointestinal: Negative.   Endocrine: Negative.   Genitourinary: Negative.   Musculoskeletal: Negative.   Skin: Negative.   Allergic/Immunologic: Negative.   Neurological: Negative.   Hematological: Negative.   Psychiatric/Behavioral: Negative.    All other systems reviewed and are negative.  Past Medical History:  Diagnosis Date   Allergy    CAD (coronary artery disease)    From medical records in [place]   Chicken pox    Chronic kidney disease    From medical records in [place]   DJD (degenerative joint disease)    Hyperlipidemia    Hypertension    Prostate cancer (HCC)    Tortuous aorta (HCC)    From medical records in [place]    Social History   Socioeconomic History   Marital status: Married    Spouse name: Not on file   Number of children: 0   Years of education: Not on file   Highest education level: Associate degree: occupational, Scientist, product/process development, or vocational program  Occupational History   Occupation: Part time transportation  Tobacco Use   Smoking status: Former    Current packs/day: 0.00    Average packs/day: 1 pack/day for 25.0 years (25.0 ttl pk-yrs)    Types: Cigarettes    Start date: 03/23/1962    Quit date: 03/24/1987    Years since quitting: 35.9   Smokeless tobacco: Never  Vaping Use   Vaping status: Never Used  Substance and Sexual Activity  Alcohol use: Yes    Alcohol/week: 0.0 standard drinks of alcohol    Comment: Socially; one drink per week.   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    Married   No biological children   Social Determinants of Health   Financial Resource Strain: Low Risk  (02/07/2023)   Overall Financial Resource Strain (CARDIA)    Difficulty of Paying Living Expenses: Not hard at all  Food Insecurity: No Food  Insecurity (02/07/2023)   Hunger Vital Sign    Worried About Running Out of Food in the Last Year: Never true    Ran Out of Food in the Last Year: Never true  Transportation Needs: No Transportation Needs (02/07/2023)   PRAPARE - Administrator, Civil Service (Medical): No    Lack of Transportation (Non-Medical): No  Physical Activity: Insufficiently Active (02/07/2023)   Exercise Vital Sign    Days of Exercise per Week: 2 days    Minutes of Exercise per Session: 60 min  Stress: No Stress Concern Present (02/07/2023)   [employer] of Occupational Health - Occupational Stress Questionnaire    Feeling of Stress : Not at all  Social Connections: Moderately Isolated (02/07/2023)   Social Connection and Isolation Panel [NHANES]    Frequency of Communication with Friends and Family: More than three times a week    Frequency of Social Gatherings with Friends and Family: Three times a week    Attends Religious Services: Never    Active Member of Clubs or Organizations: No    Attends Banker Meetings: Not on file    Marital Status: Married  Catering manager Violence: Not At Risk (11/19/2020)   Humiliation, Afraid, Rape, and Kick questionnaire    Fear of Current or Ex-Partner: No    Emotionally Abused: No    Physically Abused: No    Sexually Abused: No    Past Surgical History:  Procedure Laterality Date   COLONOSCOPY     . 10 yrs ago   HERNIA REPAIR  1994    Family History  Problem Relation Age of Onset   Dementia Mother    Miscarriages / [place] Mother    Stroke Father        From dental visit   Prostate cancer Father    Cancer Father 95       prostate, treated with radiation   Lung cancer Paternal Uncle    Cancer Paternal Uncle        unknown/heavy smoker   Colon cancer Neg Hx    Colon polyps Neg Hx    Esophageal cancer Neg Hx    Rectal cancer Neg Hx    Stomach cancer Neg Hx     No Known Allergies  Current Outpatient Medications on  File Prior to Visit  Medication Sig Dispense Refill   aspirin 81 MG tablet Take 81 mg by mouth daily.     benazepril-hydrochlorthiazide (LOTENSIN HCT) 20-25 MG tablet Take 1 tablet by mouth daily. 90 tablet 0   No current facility-administered medications on file prior to visit.    BP (!) 150/60   Pulse (!) 46   Temp 97.7 F (36.5 C) (Oral)   Ht 5' 10.75" (1.797 m)   Wt 211 lb (95.7 kg)   SpO2 98%   BMI 29.64 kg/m  Objective:   Physical Exam Vitals and nursing note reviewed.  Constitutional:      General: He is not in acute distress.    Appearance: Normal appearance. He is obese. He is not ill-appearing.  HENT:     Head: Normocephalic and atraumatic.     Right Ear: Tympanic membrane, ear canal and external ear normal. There is no impacted cerumen.     Left Ear: Tympanic membrane, ear canal and external ear normal. There is no impacted cerumen.     Nose: Nose normal. No congestion or rhinorrhea.     Mouth/Throat:     Mouth: Mucous membranes are moist.     Pharynx: Oropharynx is clear.  Eyes:     Extraocular Movements: Extraocular movements intact.     Conjunctiva/sclera: Conjunctivae normal.     Pupils: Pupils are equal, round, and reactive to light.  Neck:     Vascular: No carotid bruit.  Cardiovascular:     Rate and Rhythm: Normal rate and regular rhythm.     Pulses: Normal pulses.     Heart sounds: No murmur heard.    No friction rub. No gallop.  Pulmonary:     Effort: Pulmonary effort is normal.     Breath sounds: Normal breath sounds.  Abdominal:     General: Abdomen is flat. Bowel sounds are normal. There is no distension.     Palpations: Abdomen is soft. There is no mass.     Tenderness: There is no abdominal tenderness. There is no guarding or rebound.     Hernia: No hernia is present.  Musculoskeletal:        General: Normal range of motion.     Cervical back: Normal range of motion and neck supple.  Lymphadenopathy:     Cervical: No cervical  adenopathy.  Skin:    General: Skin is warm and dry.     Capillary Refill: Capillary refill takes less than 2 seconds.  Neurological:     General: No focal deficit present.     Mental Status: He is alert and oriented to person, place, and time.  Psychiatric:        Mood and Affect: Mood normal.        Behavior: Behavior normal.        Thought Content: Thought content normal.        Judgment: Judgment normal.       Assessment & Plan:  1. Routine general medical examination at a health care facility Today patient counseled on age appropriate routine health concerns for screening and prevention, each reviewed and up to date or declined. Immunizations reviewed and up to date or declined. Labs ordered and reviewed. Risk factors for depression reviewed and negative. Hearing function and visual acuity are intact. ADLs screened and addressed as needed. Functional ability and level of safety reviewed and appropriate. Education, counseling and referrals performed based on assessed risks today. Patient provided with a copy of personalized plan for preventive services. - Follow up in one year or sooner if needed - Encouraged weight loss through diet and exercise    2. Essential hypertension - Well controlled. No change in medication  - Lipid panel; Future - TSH; Future - CBC; Future - Comprehensive metabolic panel; Future - Comprehensive metabolic panel - TSH - CBC - Lipid panel  3. Hyperlipidemia, unspecified hyperlipidemia type - Reluctant to start statin but is willing if needed - Lipid panel; Future - TSH; Future - CBC; Future - Comprehensive metabolic panel; Future -  Comprehensive metabolic panel - TSH - CBC - Lipid panel  4. History of prostate cancer  - PSA; Future - PSA  5. Need for influenza vaccination  - Flu Vaccine Trivalent High Dose (Fluad)  Shirline Frees, NP

## 2023-02-11 NOTE — Telephone Encounter (Signed)
Updated patient on his labs.   LDL increased. He is ok with starting a statin

## 2023-02-11 NOTE — Patient Instructions (Signed)
Health Maintenance Due  Topic Date Due   Zoster Vaccines- Shingrix (1 of 2) 12/31/1966   COVID-19 Vaccine (3 - Pfizer risk series) 07/18/2019   INFLUENZA VACCINE  10/22/2022   Medicare Annual Wellness (AWV)  11/15/2022       02/11/2023    8:26 AM 11/14/2021    9:25 AM 11/19/2020    8:28 AM  Depression screen PHQ 2/9  Decreased Interest 0 0 0  Down, Depressed, Hopeless 0 0 0  PHQ - 2 Score 0 0 0  Altered sleeping 0 0   Tired, decreased energy 0 0   Change in appetite 0 0   Feeling bad or failure about yourself  0 0   Trouble concentrating 0 0   Moving slowly or fidgety/restless 0 0   Suicidal thoughts 0 0   PHQ-9 Score 0 0   Difficult doing work/chores Not difficult at all Not difficult at all

## 2023-03-04 DIAGNOSIS — K080 Exfoliation of teeth due to systemic causes: Secondary | ICD-10-CM | POA: Diagnosis not present

## 2023-03-08 DIAGNOSIS — K080 Exfoliation of teeth due to systemic causes: Secondary | ICD-10-CM | POA: Diagnosis not present

## 2023-04-28 ENCOUNTER — Ambulatory Visit (INDEPENDENT_AMBULATORY_CARE_PROVIDER_SITE_OTHER): Payer: Medicare Other | Admitting: Adult Health

## 2023-04-28 VITALS — BP 130/60 | HR 44 | Temp 97.9°F | Ht 70.5 in | Wt 217.0 lb

## 2023-04-28 DIAGNOSIS — R040 Epistaxis: Secondary | ICD-10-CM

## 2023-04-28 NOTE — Progress Notes (Signed)
 Subjective:    Patient ID: Jon Summers, male    DOB: 1946/03/14, 78 y.o.   MRN: [id number]  Epistaxis    78 year old male who  has a past medical history of Allergy, CAD (coronary artery disease), Chicken pox, Chronic kidney disease, DJD (degenerative joint disease), Hyperlipidemia, Hypertension, Prostate cancer (HCC), and Tortuous aorta (HCC).  He presents to the office today for epistaxis. He reports  several nose bleeds over the last month with three in the last week. These are spontaneous and out of the left nostril. He is able to stop the bleeding pretty quickly  He also feels like he has nasal congestion with clear rhinorrhea. Denies other symptoms. He has not been using any nasal sprays   Review of Systems  HENT:  Positive for nosebleeds.    See HPI   Past Medical History:  Diagnosis Date   Allergy    CAD (coronary artery disease)    From medical records in [place]    Chicken pox    Chronic kidney disease    From medical records in [place]    DJD (degenerative joint disease)    Hyperlipidemia    Hypertension    Prostate cancer (HCC)    Tortuous aorta (HCC)    From medical records in [place]     Social History   Socioeconomic History   Marital status: Married    Spouse name: Not on file   Number of children: 0   Years of education: Not on file   Highest education level: Associate degree: occupational, scientist, product/process development, or vocational program  Occupational History   Occupation: Part time transportation  Tobacco Use   Smoking status: Former    Current packs/day: 0.00    Average packs/day: 1 pack/day for 25.0 years (25.0 ttl pk-yrs)    Types: Cigarettes    Start date: 03/23/1962    Quit date: 03/24/1987    Years since quitting: 36.1   Smokeless tobacco: Never  Vaping Use   Vaping status: Never Used  Substance and Sexual Activity   Alcohol use: Yes    Alcohol/week: 0.0 standard drinks of alcohol    Comment: Socially; one drink per week.   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    Married   No biological children   Social Drivers of Health   Financial Resource Strain: Patient Declined (04/27/2023)   Overall Financial Resource Strain (CARDIA)    Difficulty of Paying Living Expenses: Patient declined  Food Insecurity: Patient Declined (04/27/2023)   Hunger Vital Sign    Worried About Running Out of Food in the Last Year: Patient declined    Ran Out of Food in the Last Year: Patient declined  Transportation Needs: Patient Declined (04/27/2023)   PRAPARE - Administrator, Civil Service (Medical): Patient declined    Lack of Transportation (Non-Medical): Patient declined  Physical Activity: Insufficiently Active (04/27/2023)   Exercise Vital Sign    Days of Exercise per Week: 1 day    Minutes of Exercise per Session: 30 min  Stress: No Stress Concern Present (04/27/2023)   [employer] of Occupational Health - Occupational Stress Questionnaire    Feeling of Stress : Not at all  Social Connections: Unknown (04/27/2023)   Social Connection and Isolation Panel [NHANES]    Frequency of Communication with Friends and  Family: Patient declined    Frequency of Social Gatherings with Friends and Family: Patient declined    Attends Religious Services: Patient declined    Database Administrator or Organizations: Patient declined    Attends Engineer, Structural: Not on file    Marital Status: Patient declined  Recent Concern: Social Connections - Moderately Isolated (02/07/2023)   Social Connection and Isolation Panel [NHANES]    Frequency of Communication with Friends and Family: More than three times a week    Frequency of Social Gatherings with Friends and Family: Three times a week    Attends Religious Services: Never    Active Member of Clubs or Organizations: No    Attends Banker  Meetings: Not on file    Marital Status: Married  Catering Manager Violence: Not At Risk (11/19/2020)   Humiliation, Afraid, Rape, and Kick questionnaire    Fear of Current or Ex-Partner: No    Emotionally Abused: No    Physically Abused: No    Sexually Abused: No    Past Surgical History:  Procedure Laterality Date   COLONOSCOPY     . 10 yrs ago   HERNIA REPAIR  1994    Family History  Problem Relation Age of Onset   Dementia Mother    Miscarriages / Stillbirths Mother    Stroke Father        From dental visit   Prostate cancer Father    Cancer Father 40       prostate, treated with radiation   Lung cancer Paternal Uncle    Cancer Paternal Uncle        unknown/heavy smoker   Colon cancer Neg Hx    Colon polyps Neg Hx    Esophageal cancer Neg Hx    Rectal cancer Neg Hx    Stomach cancer Neg Hx     No Known Allergies  Current Outpatient Medications on File Prior to Visit  Medication Sig Dispense Refill   aspirin 81 MG tablet Take 81 mg by mouth daily.     benazepril -hydrochlorthiazide (LOTENSIN  HCT) 20-25 MG tablet Take 1 tablet by mouth daily. 90 tablet 3   rosuvastatin  (CRESTOR ) 10 MG tablet Take 1 tablet (10 mg total) by mouth daily. 90 tablet 3   No current facility-administered medications on file prior to visit.    BP 130/60   Pulse (!) 44   Temp 97.9 F (36.6 C) (Oral)   Ht 5' 10.5 (1.791 m)   Wt 217 lb (98.4 kg)   SpO2 97%   BMI 30.70 kg/m       Objective:   Physical Exam Vitals and nursing note reviewed.  Constitutional:      Appearance: Normal appearance.  HENT:     Nose: No congestion or rhinorrhea.     Left Nostril: Epistaxis present.     Right Turbinates: Not enlarged or swollen.     Left Turbinates: Not enlarged.     Comments: Small laceration on the lateral aspected of left nare. Old blood noted. No new bleeding present Cardiovascular:     Rate and Rhythm: Normal rate and regular rhythm.     Pulses: Normal pulses.     Heart  sounds: Normal heart sounds.  Pulmonary:     Effort: Pulmonary effort is normal.     Breath sounds: Normal breath sounds.  Skin:    General: Skin is warm and dry.  Neurological:     General: No focal deficit present.  Mental Status: He is alert and oriented to person, place, and time.  Psychiatric:        Mood and Affect: Mood normal.        Behavior: Behavior normal.        Thought Content: Thought content normal.       Assessment & Plan:  1. Epistaxis (Primary) - Likely from dry air. Advised humidifier at bedside and to start using normal saline spray  - Follow up if nose bleeds continues - Consider referral to ENT   Darleene Shape, NP

## 2023-04-29 DIAGNOSIS — K080 Exfoliation of teeth due to systemic causes: Secondary | ICD-10-CM | POA: Diagnosis not present

## 2023-05-05 DIAGNOSIS — K080 Exfoliation of teeth due to systemic causes: Secondary | ICD-10-CM | POA: Diagnosis not present

## 2023-05-27 ENCOUNTER — Ambulatory Visit: Admitting: Adult Health

## 2023-05-27 VITALS — BP 140/60 | HR 53 | Temp 97.6°F | Ht 70.0 in | Wt 221.0 lb

## 2023-05-27 DIAGNOSIS — Z7409 Other reduced mobility: Secondary | ICD-10-CM
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
## 2023-06-03 ENCOUNTER — Other Ambulatory Visit: Payer: Self-pay

## 2023-06-03 ENCOUNTER — Ambulatory Visit: Attending: Adult Health | Admitting: Physical Therapy

## 2023-06-03 DIAGNOSIS — Z7409 Other reduced mobility: Secondary | ICD-10-CM | POA: Diagnosis not present

## 2023-06-03 DIAGNOSIS — M2560 Stiffness of unspecified joint, not elsewhere classified: Secondary | ICD-10-CM | POA: Insufficient documentation

## 2023-06-03 DIAGNOSIS — M6281 Muscle weakness (generalized): Secondary | ICD-10-CM | POA: Insufficient documentation

## 2023-06-03 DIAGNOSIS — M25651 Stiffness of right hip, not elsewhere classified: Secondary | ICD-10-CM | POA: Diagnosis not present

## 2023-06-03 DIAGNOSIS — M25652 Stiffness of left hip, not elsewhere classified: Secondary | ICD-10-CM | POA: Diagnosis not present

## 2023-06-03 NOTE — Therapy (Signed)
 OUTPATIENT PHYSICAL THERAPY EVALUATION   Patient Name: Jon Summers MRN: [id number] DOB:1945-06-21, 78 y.o., male Today's Date: 06/03/2023   PCP: Shirline Frees NP REFERRING PROVIDER: Shirline Frees NP  END OF SESSION:  PT End of Session - 06/03/23 0847     Visit Number 1    Date for PT Re-Evaluation 08/12/23    Authorization Type [employer] medicare    Progress Note Due on Visit 10    PT Start Time [phone]    PT Stop Time 0930    PT Time Calculation (min) 43 min    Activity Tolerance Patient tolerated treatment well             Past Medical History:  Diagnosis Date   Allergy    CAD (coronary artery disease)    From medical records in [place]   Chicken pox    Chronic kidney disease    From medical records in [place]   DJD (degenerative joint disease)    Hyperlipidemia    Hypertension    Prostate cancer (HCC)    Tortuous aorta (HCC)    From medical records in [place]   Past Surgical History:  Procedure Laterality Date   COLONOSCOPY     . 10 yrs ago   HERNIA REPAIR  1994   Patient Active Problem List   Diagnosis Date Noted   Malignant neoplasm of prostate (HCC) 08/03/2016   Essential hypertension 12/11/2014   Hyperlipidemia 12/11/2014    ONSET DATE: > 6 months  REFERRING DIAG: Z74.09 mobility impaired  THERAPY DIAG:  weakness  Rationale for Evaluation and Treatment: Rehabilitation  SUBJECTIVE:                                                                                                                                                                                             SUBJECTIVE STATEMENT: struggle in the morning to put socks on, struggle to pick up something from the floor; difficulty getting out of the car; difficulty turning head for driving;muscles all over feel stiff; stepping into hot tub lifting leg out far enough; getting up off the floor   Golf 3x/week Sometimes walk with wife up to 5 miles Sits for part time job, walks around the building  20 min daily  PERTINENT HISTORY: HTN  PAIN:  Are you having pain? No, stiffness with these activities   PRECAUTIONS: None  WEIGHT BEARING RESTRICTIONS: No  FALLS: Has patient fallen in last 6 months? No  LIVING ENVIRONMENT: Lives with: spouse Lives in: House/apartment  PLOF: Independent  PATIENT GOALS: ease with putting on socks, improved getting in/out of the car, get in/out of  the hot tub, turn head better when driving,   OBJECTIVE:  Note: Objective measures were completed at Evaluation unless otherwise noted.   COGNITION: Overall cognitive status: Within functional limits for tasks assessed    MUSCLE LENGTH: Hamstrings: Right 50 deg; Left 50 deg            Decreased hip flexor lengths bil: passively 10 degrees extension in sidelying            Decreased hip adductor length 35 degrees right, 30 degrees left  LOWER EXTREMITY ROM:   passive hip flexion to 90 degrees, passive internal rotation 0 degrees, passive external rotation 30 degrees bil  LOWER EXTREMITY MMT:   Hip abduction 4/5 bil; hip extension 4+/5 TRUNK STRENGTH:  Decreased activation of transverse abdominus muscles; abdominals 4-/5; decreased activation of lumbar multifidi; trunk extensors 4-/5   CERVICAL ROM: extension 20 degrees, right and left sidebending 8 degrees, right and left rotation 40 degrees  Shoulder internal rotation limited with reach behind back to L5   GAIT: Comments: WFLs Able to rise from a standard height chair without UE assist Needs UE support to stoop down to pick up small object from the floor   PATIENT SURVEYS:  The Patient-Specific Functional Scale  Initial:  I am going to ask you to identify up to 3 important activities that you are unable to do or are having difficulty with as a result of this problem.  Today are there any activities that you are unable to do or having difficulty with because of this?  (Patient shown scale and patient rated each activity)  Follow up: When  you first came in you had difficulty performing these activities.  Today do you still have difficulty?  Patient-Specific activity scoring scheme (Point to one number):  0 1 2 3 4 5 6 7 8 9  10 Unable                                                                                                          Able to perform To perform                                                                                                    activity at the same Activity         Level as before  Injury or problem  Activity              Putting socks on                                                                   Initial:           3             2.             In/out of the car                                                                       Initial:  3                                                                                    3.              Getting up off the floor                                                           Initial:       2                                                                                                                                               TREATMENT DATE: 06/03/23  Evaluation Initial HEP   PATIENT EDUCATION: Education details: Educated patient on anatomy and physiology of current symptoms, prognosis, plan of care as well as initial self care strategies to promote recovery Person educated: Patient Education method: Explanation Education comprehension: verbalized understanding  HOME EXERCISE PROGRAM: Access Code: [email or web address] URL: [email or web address] Date: 06/03/2023 Prepared by: Lavinia Sharps  Exercises - Supine Hip External Rotation Stretch  - 1 x daily - 7 x weekly - 1 sets - 3 reps - 20-30 hold - Supine  Lower Trunk Rotation  - 1 x daily - 7 x weekly - 1 sets - 10 reps - 20-30 hold - Supine Double Knee to  Chest  - 1 x daily - 7 x weekly - 1 sets - 3 reps - 20-30 hold - Quadruped Rocking Backward  - 1 x daily - 7 x weekly - 1 sets - 10 reps - Seated Hamstring Stretch  - 1 x daily - 7 x weekly - 1 sets - 10 reps - no hold to start hold - Side Lunge Adductor Stretch  - 1 x daily - 7 x weekly - 1 sets - 10 reps  GOALS: Goals reviewed with patient? Yes  SHORT TERM GOALS: Target date: 07/08/2023    The patient will demonstrate knowledge of basic exercises to promote improved soft tissue lengthening Baseline: Goal status: INITIAL  2.  Improved cervical extension to 30 degrees, sidebending 15 degrees and rotation 50 degrees needed for driving Baseline:  Goal status: INITIAL  3.  Improved HS length to 60 degrees needed for picking up something from the floor with greater ease Baseline:  Goal status: INITIAL  4.  Able to do a partial squat to pick up something small from the floor without UE support with ease Baseline:  Goal status: INITIAL     LONG TERM GOALS: Target date: 08/12/2023   The patient will be independent in a safe self progression of a home exercise program to promote further recovery of function  Baseline:  Goal status: INITIAL  2.  Pt will have improved hip rotation mobility needed for putting socks on with greater ease with PSFS score of 5 Baseline:  Goal status: INITIAL  3.  Patient will have upper and lower body strength to get up/down off the floor with PSFS of 5 Baseline:  Goal status: INITIAL  4.  Patient will have improved spinal and hip mobility needed for getting in/out of the car with greater ease with PSFS score of 5 Baseline:  Goal status: INITIAL  5.  The patient will have improved hip and core strength to at least 4+/5 needed for getting up and down off the floor and lifting medium to heavier objects Baseline:  Goal status: INITIAL   ASSESSMENT:  CLINICAL IMPRESSION: Patient is a 78 y.o. male who was seen today for physical therapy evaluation  and treatment for mobility impairment. He demonstrates decreased soft tissue lengths and decreased joint mobility in multiple body regions including bil hips (particularly rotation), lumbar spine, cervical spine and in shoulders (particularly internal rotation). These limitations affect function including putting on his socks, getting in and out of the car, turning his head for driving, lifting his leg to step in/out of the hot tub and difficulty getting on and off the floor.  He would benefit from PT to establish a HEP to address these impairments.  OBJECTIVE IMPAIRMENTS: decreased activity tolerance, decreased mobility, decreased ROM, decreased strength, hypomobility, increased fascial restrictions, impaired perceived functional ability, and impaired flexibility.   ACTIVITY LIMITATIONS: bending, squatting, transfers, dressing, and hygiene/grooming  PARTICIPATION LIMITATIONS: driving, community activity, occupation, and dressing  PERSONAL FACTORS: Time since onset of injury/illness/exacerbation and 1 comorbidity: HTN  are also affecting patient's functional outcome.   REHAB POTENTIAL: Good  CLINICAL DECISION MAKING: Stable/uncomplicated  EVALUATION COMPLEXITY: Low  PLAN:  PT FREQUENCY: 2x/week  PT DURATION: 10 weeks (pt out of town 2 weeks)  PLANNED INTERVENTIONS: 97164- PT Re-evaluation, 97110-Therapeutic exercises, 97530- Therapeutic activity, [id number]- Neuromuscular re-education, 97535- Self Care, 96045- Manual  therapy, [phone]- Aquatic Therapy, Patient/Family education, Balance training, Stair training, Taping, Cryotherapy, and Moist heat; dry needling  PLAN FOR NEXT SESSION: review initial HEP and progress ex's with emphasis on trunk and hip mobility but also add cervical ROM particularly rotation for driving; functional strengthening including squatting, getting up/down off the floor  Lavinia Sharps, PT 06/03/23 7:04 PM Phone: [phone] Fax: [phone]

## 2023-06-25 ENCOUNTER — Encounter

## 2023-06-25 NOTE — Therapy (Signed)
 OUTPATIENT PHYSICAL THERAPY TREATMENT   Patient Name: Jon Summers MRN: [id number] DOB:[birth year], 78 y.o., male Today's Date: 06/28/2023   PCP: Shirline Frees NP REFERRING PROVIDER: Shirline Frees NP  END OF SESSION:  PT End of Session - 06/28/23 1622     Visit Number 2    Date for PT Re-Evaluation 08/12/23    Authorization Type [employer] medicare    Progress Note Due on Visit 10    PT Start Time 1620    PT Stop Time 1700    PT Time Calculation (min) 40 min              Past Medical History:  Diagnosis Date   Allergy    CAD (coronary artery disease)    From medical records in [place]   Chicken pox    Chronic kidney disease    From medical records in [place]   DJD (degenerative joint disease)    Hyperlipidemia    Hypertension    Prostate cancer (HCC)    Tortuous aorta (HCC)    From medical records in [place]   Past Surgical History:  Procedure Laterality Date   COLONOSCOPY     . 10 yrs ago   HERNIA REPAIR  1994   Patient Active Problem List   Diagnosis Date Noted   Malignant neoplasm of prostate (HCC) 08/03/2016   Essential hypertension 12/11/2014   Hyperlipidemia 12/11/2014    ONSET DATE: > 6 months  REFERRING DIAG: Z74.09 mobility impaired  THERAPY DIAG:  weakness  Rationale for Evaluation and Treatment: Rehabilitation  SUBJECTIVE:                                                                                                                                                                                             SUBJECTIVE STATEMENT: I'm tight all over.   Golf 3x/week Sometimes walk with wife up to 5 miles Sits for part time job, walks around the building 20 min daily  PERTINENT HISTORY: HTN  PAIN:  Are you having pain? No, stiffness with these activities   PRECAUTIONS: None  WEIGHT BEARING RESTRICTIONS: No  FALLS: Has patient fallen in last 6 months? No  LIVING ENVIRONMENT: Lives with: spouse Lives in: House/apartment  PLOF:  Independent  PATIENT GOALS: ease with putting on socks, improved getting in/out of the car, get in/out of the hot tub, turn head better when driving,   OBJECTIVE:  Note: Objective measures were completed at Evaluation unless otherwise noted.   COGNITION: Overall cognitive status: Within functional limits for tasks assessed    MUSCLE LENGTH: Hamstrings: Right 50 deg; Left 50 deg  Decreased hip flexor lengths bil: passively 10 degrees extension in sidelying            Decreased hip adductor length 35 degrees right, 30 degrees left  LOWER EXTREMITY ROM:   passive hip flexion to 90 degrees, passive internal rotation 0 degrees, passive external rotation 30 degrees bil  LOWER EXTREMITY MMT:   Hip abduction 4/5 bil; hip extension 4+/5 TRUNK STRENGTH:  Decreased activation of transverse abdominus muscles; abdominals 4-/5; decreased activation of lumbar multifidi; trunk extensors 4-/5   CERVICAL ROM: extension 20 degrees, right and left sidebending 8 degrees, right and left rotation 40 degrees  Shoulder internal rotation limited with reach behind back to L5   GAIT: Comments: WFLs Able to rise from a standard height chair without UE assist Needs UE support to stoop down to pick up small object from the floor   PATIENT SURVEYS:  The Patient-Specific Functional Scale  Initial:  I am going to ask you to identify up to 3 important activities that you are unable to do or are having difficulty with as a result of this problem.  Today are there any activities that you are unable to do or having difficulty with because of this?  (Patient shown scale and patient rated each activity)  Follow up: When you first came in you had difficulty performing these activities.  Today do you still have difficulty?  Patient-Specific activity scoring scheme (Point to one number):  0 1 2 3 4 5 6 7 8 9  10 Unable                                                                                                           Able to perform To perform                                                                                                    activity at the same Activity         Level as before  Injury or problem  Activity              Putting socks on                                                                   Initial:           3             2.             In/out of the car                                                                       Initial:  3                                                                                    3.              Getting up off the floor                                                           Initial:       2                                                                                                                                               TREATMENT DATE:  06/28/23 Nustep L5 x 9 discussing schedule min increase resistance next visit Seated fig 4 2x30 sec B Seated neck rotation, SB 5 sec hold x 10 B Seated neck flex and ext 5 sec hold x 10 B Seated trunk mobility: flex/ext holding ball following with eyes x 10, diagonals/chops x 10 B, rotation x 10 B Open book x 5 B  06/03/23  Evaluation Initial HEP   PATIENT EDUCATION: Education details: HEP update Person educated: Patient Education method: Explanation Education comprehension: verbalized  understanding  HOME EXERCISE PROGRAM: Access Code: [email or web address] URL: [email or web address] Date: 06/28/2023 Prepared by: Raynelle Fanning  Exercises - Supine Hip External Rotation Stretch  - 1 x daily - 7 x weekly - 1 sets - 3 reps - 20-30 hold - Supine Lower Trunk Rotation  - 1 x daily - 7 x weekly - 1 sets - 10 reps - 20-30 hold - Supine Double Knee to Chest  - 1 x daily - 7 x weekly - 1 sets - 3 reps - 20-30 hold - Quadruped Rocking Backward  - 1 x daily - 7 x  weekly - 1 sets - 10 reps - Seated Hamstring Stretch  - 1 x daily - 7 x weekly - 1 sets - 10 reps - no hold to start hold - Side Lunge Adductor Stretch  - 1 x daily - 7 x weekly - 1 sets - 10 reps - Seated Figure 4 Piriformis Stretch  - 2 x daily - 7 x weekly - 1 sets - 3 reps - 30-60 sec hold - Seated Cervical Rotation AROM  - 1 x daily - 7 x weekly - 1 sets - 10 reps - 5 hold - Seated Cervical Sidebending AROM  - 1 x daily - 7 x weekly - 1 sets - 10 reps - 5 hold - Seated Cervical Flexion AROM  - 1 x daily - 7 x weekly - 1 sets - 10 reps - 5 hold - Seated Cervical Extension AROM  - 1 x daily - 7 x weekly - 1 sets - 10 reps - 5 hold - Seated Thoracic Extension Arms Overhead  - 1 x daily - 7 x weekly - 1-2 sets - 10 reps - 2-3 sec hold - Seated Trunk Rotation  - 1 x daily - 7 x weekly - 1-2 sets - 10 reps - 2-3 sec hold - Seated Diagonal Chops with Medicine Ball  - 1 x daily - 7 x weekly - 1-2 sets - 10 reps - 2-3 hold - Sidelying Thoracic Rotation with Open Book  - 1 x daily - 7 x weekly - 2 sets - 5 reps  GOALS: Goals reviewed with patient? Yes  SHORT TERM GOALS: Target date: 07/08/2023    The patient will demonstrate knowledge of basic exercises to promote improved soft tissue lengthening Baseline: Goal status: INITIAL  2.  Improved cervical extension to 30 degrees, sidebending 15 degrees and rotation 50 degrees needed for driving Baseline:  Goal status: INITIAL  3.  Improved HS length to 60 degrees needed for picking up something from the floor with greater ease Baseline:  Goal status: INITIAL  4.  Able to do a partial squat to pick up something small from the floor without UE support with ease Baseline:  Goal status: INITIAL     LONG TERM GOALS: Target date: 08/12/2023   The patient will be independent in a safe self progression of a home exercise program to promote further recovery of function  Baseline:  Goal status: INITIAL  2.  Pt will have improved hip rotation  mobility needed for putting socks on with greater ease with PSFS score of 5 Baseline:  Goal status: INITIAL  3.  Patient will have upper and lower body strength to get up/down off the floor with PSFS of 5 Baseline:  Goal status: INITIAL  4.  Patient will have improved spinal and hip mobility needed for getting in/out of the car with greater ease with PSFS score of 5 Baseline:  Goal status:  INITIAL  5.  The patient will have improved hip and core strength to at least 4+/5 needed for getting up and down off the floor and lifting medium to heavier objects Baseline:  Goal status: INITIAL   ASSESSMENT:  CLINICAL IMPRESSION: Excellent response to all mobility exercises today. Patient gained ROM in all planes. HEP updated with all exercises, but PT discussed that patient can break up the many exercises he has to make them manageable. He is limited to when he can come to PT due to work, so may only be one time a week. He also prefers 9:30 time frame if available.   Eval: Patient is a 78 y.o. male who was seen today for physical therapy evaluation and treatment for mobility impairment. He demonstrates decreased soft tissue lengths and decreased joint mobility in multiple body regions including bil hips (particularly rotation), lumbar spine, cervical spine and in shoulders (particularly internal rotation). These limitations affect function including putting on his socks, getting in and out of the car, turning his head for driving, lifting his leg to step in/out of the hot tub and difficulty getting on and off the floor.  He would benefit from PT to establish a HEP to address these impairments.  OBJECTIVE IMPAIRMENTS: decreased activity tolerance, decreased mobility, decreased ROM, decreased strength, hypomobility, increased fascial restrictions, impaired perceived functional ability, and impaired flexibility.   ACTIVITY LIMITATIONS: bending, squatting, transfers, dressing, and  hygiene/grooming  PARTICIPATION LIMITATIONS: driving, community activity, occupation, and dressing  PERSONAL FACTORS: Time since onset of injury/illness/exacerbation and 1 comorbidity: HTN  are also affecting patient's functional outcome.   REHAB POTENTIAL: Good  CLINICAL DECISION MAKING: Stable/uncomplicated  EVALUATION COMPLEXITY: Low  PLAN:  PT FREQUENCY: 2x/week  PT DURATION: 10 weeks (pt out of town 2 weeks)  PLANNED INTERVENTIONS: [phone]- PT Re-evaluation, 97110-Therapeutic exercises, 97530- Therapeutic activity, [phone]- Neuromuscular re-education, [phone]- Self Care, [id number]- Manual therapy, [phone]- Aquatic Therapy, Patient/Family education, Balance training, Stair training, Taping, Cryotherapy, and Moist heat; dry needling  PLAN FOR NEXT SESSION: review initial HEP and progress ex's with emphasis on trunk and hip mobility but also add cervical ROM particularly rotation for driving; functional strengthening including squatting, getting up/down off the floor  Solon Palm, PT  06/28/23 5:05 PM Phone: [phone] Fax: [phone]

## 2023-06-28 ENCOUNTER — Ambulatory Visit: Attending: Adult Health | Admitting: Physical Therapy

## 2023-06-28 ENCOUNTER — Encounter: Payer: Self-pay | Admitting: Physical Therapy

## 2023-06-28 DIAGNOSIS — R262 Difficulty in walking, not elsewhere classified: Secondary | ICD-10-CM | POA: Insufficient documentation

## 2023-06-28 DIAGNOSIS — M25652 Stiffness of left hip, not elsewhere classified: Secondary | ICD-10-CM | POA: Diagnosis not present

## 2023-06-28 DIAGNOSIS — M2560 Stiffness of unspecified joint, not elsewhere classified: Secondary | ICD-10-CM | POA: Diagnosis not present

## 2023-06-28 DIAGNOSIS — R293 Abnormal posture: Secondary | ICD-10-CM | POA: Diagnosis not present

## 2023-06-28 DIAGNOSIS — M25651 Stiffness of right hip, not elsewhere classified: Secondary | ICD-10-CM | POA: Insufficient documentation

## 2023-06-28 DIAGNOSIS — R252 Cramp and spasm: Secondary | ICD-10-CM | POA: Insufficient documentation

## 2023-06-28 DIAGNOSIS — M6281 Muscle weakness (generalized): Secondary | ICD-10-CM | POA: Diagnosis not present

## 2023-07-01 ENCOUNTER — Ambulatory Visit

## 2023-07-01 DIAGNOSIS — M2560 Stiffness of unspecified joint, not elsewhere classified: Secondary | ICD-10-CM

## 2023-07-01 DIAGNOSIS — R293 Abnormal posture: Secondary | ICD-10-CM

## 2023-07-01 DIAGNOSIS — M25651 Stiffness of right hip, not elsewhere classified: Secondary | ICD-10-CM | POA: Diagnosis not present

## 2023-07-01 DIAGNOSIS — M25652 Stiffness of left hip, not elsewhere classified: Secondary | ICD-10-CM

## 2023-07-01 DIAGNOSIS — R252 Cramp and spasm: Secondary | ICD-10-CM | POA: Diagnosis not present

## 2023-07-01 DIAGNOSIS — R262 Difficulty in walking, not elsewhere classified: Secondary | ICD-10-CM | POA: Diagnosis not present

## 2023-07-01 DIAGNOSIS — M6281 Muscle weakness (generalized): Secondary | ICD-10-CM | POA: Diagnosis not present

## 2023-07-01 NOTE — Therapy (Signed)
 OUTPATIENT PHYSICAL THERAPY TREATMENT   Patient Name: Jon Summers MRN: [id number] DOB:[birth year], 78 y.o., male Today's Date: 07/01/2023   PCP: Shirline Frees NP REFERRING PROVIDER: Shirline Frees NP  END OF SESSION:  PT End of Session - 07/01/23 0804     Visit Number 3    Date for PT Re-Evaluation 08/12/23    Authorization Type [employer] medicare    Progress Note Due on Visit 10    PT Start Time 0804    PT Stop Time 0845    PT Time Calculation (min) 41 min    Activity Tolerance Patient tolerated treatment well    Behavior During Therapy WFL for tasks assessed/performed              Past Medical History:  Diagnosis Date   Allergy    CAD (coronary artery disease)    From medical records in [place]   Chicken pox    Chronic kidney disease    From medical records in [place]   DJD (degenerative joint disease)    Hyperlipidemia    Hypertension    Prostate cancer (HCC)    Tortuous aorta (HCC)    From medical records in [place]   Past Surgical History:  Procedure Laterality Date   COLONOSCOPY     . 10 yrs ago   HERNIA REPAIR  1994   Patient Active Problem List   Diagnosis Date Noted   Malignant neoplasm of prostate (HCC) 08/03/2016   Essential hypertension 12/11/2014   Hyperlipidemia 12/11/2014    ONSET DATE: > 6 months  REFERRING DIAG: Z74.09 mobility impaired  THERAPY DIAG:  weakness  Rationale for Evaluation and Treatment: Rehabilitation  SUBJECTIVE:                                                                                                                                                                                             SUBJECTIVE STATEMENT: No new complaints. "Doing ok" Stiffness but denies pain.    Golf 3x/week Sometimes walk with wife up to 5 miles Sits for part time job, walks around the building 20 min daily  PERTINENT HISTORY: HTN  PAIN:  Are you having pain? No, stiffness with these activities   PRECAUTIONS: None  WEIGHT  BEARING RESTRICTIONS: No  FALLS: Has patient fallen in last 6 months? No  LIVING ENVIRONMENT: Lives with: spouse Lives in: House/apartment  PLOF: Independent  PATIENT GOALS: ease with putting on socks, improved getting in/out of the car, get in/out of the hot tub, turn head better when driving,   OBJECTIVE:  Note: Objective measures were completed at Evaluation unless otherwise noted.  COGNITION: Overall cognitive status: Within functional limits for tasks assessed    MUSCLE LENGTH: Hamstrings: Right 50 deg; Left 50 deg            Decreased hip flexor lengths bil: passively 10 degrees extension in sidelying            Decreased hip adductor length 35 degrees right, 30 degrees left  LOWER EXTREMITY ROM:   passive hip flexion to 90 degrees, passive internal rotation 0 degrees, passive external rotation 30 degrees bil  LOWER EXTREMITY MMT:   Hip abduction 4/5 bil; hip extension 4+/5 TRUNK STRENGTH:  Decreased activation of transverse abdominus muscles; abdominals 4-/5; decreased activation of lumbar multifidi; trunk extensors 4-/5   CERVICAL ROM: extension 20 degrees, right and left sidebending 8 degrees, right and left rotation 40 degrees  Shoulder internal rotation limited with reach behind back to L5   GAIT: Comments: WFLs Able to rise from a standard height chair without UE assist Needs UE support to stoop down to pick up small object from the floor   PATIENT SURVEYS:  The Patient-Specific Functional Scale  Initial:  I am going to ask you to identify up to 3 important activities that you are unable to do or are having difficulty with as a result of this problem.  Today are there any activities that you are unable to do or having difficulty with because of this?  (Patient shown scale and patient rated each activity)  Follow up: When you first came in you had difficulty performing these activities.  Today do you still have difficulty?  Patient-Specific activity scoring  scheme (Point to one number):  0 1 2 3 4 5 6 7 8 9  10 Unable                                                                                                          Able to perform To perform                                                                                                    activity at the same Activity         Level as before  Injury or problem  Activity              Putting socks on                                                                   Initial:           3             2.             In/out of the car                                                                       Initial:  3                                                                                    3.              Getting up off the floor                                                           Initial:       2                                                                                                                                               TREATMENT DATE:  07/01/23 Nustep L5 x 9 min (PT present to discuss status and educate on the effect of statins- patient had mentioned that the symptoms seem to have worsened when he started the new statin) Standing hamstring stretch 3 x 30 sec both Standing quad/hip flexor stretch 3 x 30 sec both Seated piriformis stretch 3 x 30 sec both Sit to stand x 10 Squat to mat table x 10 Quadruped thread the needle with thoracic rotation x 10 each side Side lying open book x  10 each side Reviewed all HEP and updated to add new stretches Educated patient on statins and encouraged to discuss options with MD  06/28/23 Nustep L5 x 9 discussing schedule min increase resistance next visit Seated fig 4 2x30 sec B Seated neck rotation, SB 5 sec hold x 10 B Seated neck flex and ext 5 sec hold x 10 B Seated trunk mobility: flex/ext holding ball  following with eyes x 10, diagonals/chops x 10 B, rotation x 10 B Open book x 5 B  06/03/23  Evaluation Initial HEP   PATIENT EDUCATION: Education details: HEP update Person educated: Patient Education method: Explanation Education comprehension: verbalized understanding  HOME EXERCISE PROGRAM: Access Code: [email or web address] URL: [email or web address] Date: 07/01/2023 Prepared by: Mikey Kirschner  Exercises - Supine Hip External Rotation Stretch  - 1 x daily - 7 x weekly - 1 sets - 3 reps - 20-30 hold - Supine Lower Trunk Rotation  - 1 x daily - 7 x weekly - 1 sets - 10 reps - 20-30 hold - Supine Double Knee to Chest  - 1 x daily - 7 x weekly - 1 sets - 3 reps - 20-30 hold - Quadruped Rocking Backward  - 1 x daily - 7 x weekly - 1 sets - 10 reps - Seated Figure 4 Piriformis Stretch  - 2 x daily - 7 x weekly - 1 sets - 3 reps - 30-60 sec hold - Seated Hamstring Stretch  - 1 x daily - 7 x weekly - 1 sets - 10 reps - no hold to start hold - Seated Cervical Rotation AROM  - 1 x daily - 7 x weekly - 1 sets - 10 reps - 5 hold - Seated Cervical Sidebending AROM  - 1 x daily - 7 x weekly - 1 sets - 10 reps - 5 hold - Seated Cervical Flexion AROM  - 1 x daily - 7 x weekly - 1 sets - 10 reps - 5 hold - Seated Cervical Extension AROM  - 1 x daily - 7 x weekly - 1 sets - 10 reps - 5 hold - Seated Thoracic Extension Arms Overhead  - 1 x daily - 7 x weekly - 1-2 sets - 10 reps - 2-3 sec hold - Seated Trunk Rotation  - 1 x daily - 7 x weekly - 1-2 sets - 10 reps - 2-3 sec hold - Seated Diagonal Chops with Medicine Ball  - 1 x daily - 7 x weekly - 1-2 sets - 10 reps - 2-3 hold - Sidelying Thoracic Rotation with Open Book  - 1 x daily - 7 x weekly - 2 sets - 5 reps - Side Lunge Adductor Stretch  - 1 x daily - 7 x weekly - 1 sets - 10 reps - Standing Hamstring Stretch on Chair  - 1 x daily - 7 x weekly - 1 sets - 3 reps - 30 sec hold - Standing Quad Stretch with Table and Chair Support  - 1 x  daily - 7 x weekly - 3 sets - 3 reps - 30 sec hold - Sit to Stand Without Arm Support  - 1 x daily - 7 x weekly - 2 sets - 10 reps - Squat with Chair Touch  - 1 x daily - 7 x weekly - 2 sets - 10 reps  GOALS: Goals reviewed with patient? Yes  SHORT TERM GOALS: Target date: 07/08/2023    The patient will demonstrate knowledge of basic exercises to promote improved  soft tissue lengthening Baseline: Goal status: MET 07/01/23  2.  Improved cervical extension to 30 degrees, sidebending 15 degrees and rotation 50 degrees needed for driving Baseline:  Goal status: INITIAL  3.  Improved HS length to 60 degrees needed for picking up something from the floor with greater ease Baseline:  Goal status: INITIAL  4.  Able to do a partial squat to pick up something small from the floor without UE support with ease Baseline:  Goal status: INITIAL     LONG TERM GOALS: Target date: 08/12/2023   The patient will be independent in a safe self progression of a home exercise program to promote further recovery of function  Baseline:  Goal status: INITIAL  2.  Pt will have improved hip rotation mobility needed for putting socks on with greater ease with PSFS score of 5 Baseline:  Goal status: INITIAL  3.  Patient will have upper and lower body strength to get up/down off the floor with PSFS of 5 Baseline:  Goal status: INITIAL  4.  Patient will have improved spinal and hip mobility needed for getting in/out of the car with greater ease with PSFS score of 5 Baseline:  Goal status: INITIAL  5.  The patient will have improved hip and core strength to at least 4+/5 needed for getting up and down off the floor and lifting medium to heavier objects Baseline:  Goal status: INITIAL   ASSESSMENT:  CLINICAL IMPRESSION: Patient was not aware of possibility that the new statin he was taking may be affecting his leg stiffness.   Educated patient on common side effects of statins, common supplement of  coQ10 often prescribed, and pain management.  He completed all stretches without increase in pain.  He has pronounced lack of hip and thoracolumbar mobility.  We worked on more thoracic rotaion today by added "thread the needle followed by upward thoracic rotation".  He struggled with this but completed 10 reps on each side.     Eval: Patient is a 78 y.o. male who was seen today for physical therapy evaluation and treatment for mobility impairment. He demonstrates decreased soft tissue lengths and decreased joint mobility in multiple body regions including bil hips (particularly rotation), lumbar spine, cervical spine and in shoulders (particularly internal rotation). These limitations affect function including putting on his socks, getting in and out of the car, turning his head for driving, lifting his leg to step in/out of the hot tub and difficulty getting on and off the floor.  He would benefit from PT to establish a HEP to address these impairments.  OBJECTIVE IMPAIRMENTS: decreased activity tolerance, decreased mobility, decreased ROM, decreased strength, hypomobility, increased fascial restrictions, impaired perceived functional ability, and impaired flexibility.   ACTIVITY LIMITATIONS: bending, squatting, transfers, dressing, and hygiene/grooming  PARTICIPATION LIMITATIONS: driving, community activity, occupation, and dressing  PERSONAL FACTORS: Time since onset of injury/illness/exacerbation and 1 comorbidity: HTN  are also affecting patient's functional outcome.   REHAB POTENTIAL: Good  CLINICAL DECISION MAKING: Stable/uncomplicated  EVALUATION COMPLEXITY: Low  PLAN:  PT FREQUENCY: 2x/week  PT DURATION: 10 weeks (pt out of town 2 weeks)  PLANNED INTERVENTIONS: [phone]- PT Re-evaluation, 97110-Therapeutic exercises, 97530- Therapeutic activity, [id number]- Neuromuscular re-education, [phone]- Self Care, [id number]- Manual therapy, [phone]- Aquatic Therapy, Patient/Family education, Balance  training, Stair training, Taping, Cryotherapy, and Moist heat; dry needling  PLAN FOR NEXT SESSION: review new exercises added to HEP and progress ex's with emphasis on trunk and hip mobility but also add cervical ROM particularly  rotation for driving; functional strengthening including squatting, getting up/down off the floor  Dauphin B. Raelynne Ludwick, PT 07/01/23 8:53 AM [facility] [address], [address] [place], [place] [address] Phone # [phone] Fax [phone]

## 2023-07-05 ENCOUNTER — Encounter: Admitting: Physical Therapy

## 2023-07-08 ENCOUNTER — Encounter

## 2023-07-12 ENCOUNTER — Encounter: Admitting: Physical Therapy

## 2023-07-15 ENCOUNTER — Ambulatory Visit

## 2023-07-15 DIAGNOSIS — M25651 Stiffness of right hip, not elsewhere classified: Secondary | ICD-10-CM | POA: Diagnosis not present

## 2023-07-15 DIAGNOSIS — R262 Difficulty in walking, not elsewhere classified: Secondary | ICD-10-CM

## 2023-07-15 DIAGNOSIS — M6281 Muscle weakness (generalized): Secondary | ICD-10-CM

## 2023-07-15 DIAGNOSIS — M2560 Stiffness of unspecified joint, not elsewhere classified: Secondary | ICD-10-CM

## 2023-07-15 DIAGNOSIS — R252 Cramp and spasm: Secondary | ICD-10-CM | POA: Diagnosis not present

## 2023-07-15 DIAGNOSIS — M25652 Stiffness of left hip, not elsewhere classified: Secondary | ICD-10-CM

## 2023-07-15 DIAGNOSIS — R293 Abnormal posture: Secondary | ICD-10-CM | POA: Diagnosis not present

## 2023-07-15 NOTE — Therapy (Signed)
 OUTPATIENT PHYSICAL THERAPY TREATMENT   Patient Name: Jon Summers MRN: [id number] DOB:03/09/1946, 78 y.o., male Today's Date: 07/15/2023   PCP: Alto Atta NP REFERRING PROVIDER: Alto Atta NP  END OF SESSION:  PT End of Session - 07/15/23 0931     Visit Number 4    Date for PT Re-Evaluation 08/12/23    Authorization Type [employer] medicare    PT Start Time [phone]    PT Stop Time 0929    PT Time Calculation (min) 42 min    Activity Tolerance Patient tolerated treatment well    Behavior During Therapy WFL for tasks assessed/performed               Past Medical History:  Diagnosis Date   Allergy    CAD (coronary artery disease)    From medical records in [place]    Chicken pox    Chronic kidney disease    From medical records in [place]    DJD (degenerative joint disease)    Hyperlipidemia    Hypertension    Prostate cancer (HCC)    Tortuous aorta (HCC)    From medical records in [place]    Past Surgical History:  Procedure Laterality Date   COLONOSCOPY     . 10 yrs ago   HERNIA REPAIR  1994   Patient Active Problem List   Diagnosis Date Noted   Malignant neoplasm of prostate (HCC) 08/03/2016   Essential hypertension 12/11/2014   Hyperlipidemia 12/11/2014    ONSET DATE: > 6 months  REFERRING DIAG: Z74.09 mobility impaired  THERAPY DIAG:  weakness  Rationale for Evaluation and Treatment: Rehabilitation  SUBJECTIVE:                                                                                                                                                                                             SUBJECTIVE STATEMENT: Felt good after last session.  I'm no longer working.   Golf 3x/week Sometimes walk with wife up to 5 miles   PERTINENT HISTORY: HTN  PAIN:  Are you having pain? No, stiffness with these activities   PRECAUTIONS: None  WEIGHT BEARING RESTRICTIONS: No  FALLS: Has patient fallen in last 6 months? No  LIVING  ENVIRONMENT: Lives with: spouse Lives in: House/apartment  PLOF: Independent  PATIENT GOALS: ease with putting on socks, improved getting in/out of the car, get in/out of the hot tub, turn head better when driving,   OBJECTIVE:  Note: Objective measures were completed at Evaluation unless otherwise noted.   COGNITION: Overall cognitive status: Within functional limits for tasks assessed    MUSCLE LENGTH: Hamstrings: Right 50 deg;  Left 50 deg            Decreased hip flexor lengths bil: passively 10 degrees extension in sidelying            Decreased hip adductor length 35 degrees right, 30 degrees left  LOWER EXTREMITY ROM:   passive hip flexion to 90 degrees, passive internal rotation 0 degrees, passive external rotation 30 degrees bil  LOWER EXTREMITY MMT:   Hip abduction 4/5 bil; hip extension 4+/5 TRUNK STRENGTH:  Decreased activation of transverse abdominus muscles; abdominals 4-/5; decreased activation of lumbar multifidi; trunk extensors 4-/5   CERVICAL ROM: extension 20 degrees, right and left sidebending 8 degrees, right and left rotation 40 degrees 07/15/23: rotation 45 degrees Rt and Lt, Extension 30 degrees, Lt sidebending 15, Rt sidebending 15 degrees   Shoulder internal rotation limited with reach behind back to L5   GAIT: Comments: WFLs Able to rise from a standard height chair without UE assist Needs UE support to stoop down to pick up small object from the floor   PATIENT SURVEYS:  The Patient-Specific Functional Scale  Initial:  I am going to ask you to identify up to 3 important activities that you are unable to do or are having difficulty with as a result of this problem.  Today are there any activities that you are unable to do or having difficulty with because of this?  (Patient shown scale and patient rated each activity)  Follow up: When you first came in you had difficulty performing these activities.  Today do you still have  difficulty?  Patient-Specific activity scoring scheme (Point to one number):  0 1 2 3 4 5 6 7 8 9  10 Unable                                                                                                          Able to perform To perform                                                                                                    activity at the same Activity         Level as before  Injury or problem  Activity              Putting socks on                                                                   Initial:           3      07/15/23: 4       2.             In/out of the car                                                                       Initial:  3  07/15/23: 4                                                                                    3.              Getting up off the floor                                                           Initial:       2  07/15/23: 2                                                                                                                                              TREATMENT DATE:   07/15/23 Nustep L5 x 9 min (PT present to discuss status and educate on the effect of statins- patient had mentioned that the symptoms seem to have worsened when he started the new statin) Standing hamstring stretch 3 x 30 sec both Standing quad/hip flexor stretch 3 x 30 sec both Seated piriformis stretch 3 x 30 sec both Sit to stand x 10, 10# x10 Staggered stance x10 each  Cervical A/ROM 3 ways x3 each  Quadruped  thread the needle with thoracic rotation x 10 each side Side lying open book x 10 each side Seated thoracic rotation x 5 each- tried thread the needle but too painful on knees and wrists 07/01/23 Nustep L5 x 9 min (PT present to discuss status and educate on the effect of statins- patient had mentioned that the symptoms  seem to have worsened when he started the new statin) Standing hamstring stretch 3 x 30 sec both Standing quad/hip flexor stretch 3 x 30 sec both Seated piriformis stretch 3 x 30 sec both Sit to stand x 10 Squat to mat table x 10 Quadruped thread the needle with thoracic rotation x 10 each side Side lying open book x 10 each side Reviewed all HEP and updated to add new stretches Educated patient on statins and encouraged to discuss options with MD  06/28/23 Nustep L5 x 9 discussing schedule min increase resistance next visit Seated fig 4 2x30 sec B Seated neck rotation, SB 5 sec hold x 10 B Seated neck flex and ext 5 sec hold x 10 B Seated trunk mobility: flex/ext holding ball following with eyes x 10, diagonals/chops x 10 B, rotation x 10 B Open book x 5 B   PATIENT EDUCATION: Education details: HEP update Person educated: Patient Education method: Explanation Education comprehension: verbalized understanding  HOME EXERCISE PROGRAM: Access Code: [email or web address] URL: [email or web address] Date: 07/01/2023 Prepared by: Aletha Anderson  Exercises - Supine Hip External Rotation Stretch  - 1 x daily - 7 x weekly - 1 sets - 3 reps - 20-30 hold - Supine Lower Trunk Rotation  - 1 x daily - 7 x weekly - 1 sets - 10 reps - 20-30 hold - Supine Double Knee to Chest  - 1 x daily - 7 x weekly - 1 sets - 3 reps - 20-30 hold - Quadruped Rocking Backward  - 1 x daily - 7 x weekly - 1 sets - 10 reps - Seated Figure 4 Piriformis Stretch  - 2 x daily - 7 x weekly - 1 sets - 3 reps - 30-60 sec hold - Seated Hamstring Stretch  - 1 x daily - 7 x weekly - 1 sets - 10 reps - no hold to start hold - Seated Cervical Rotation AROM  - 1 x daily - 7 x weekly - 1 sets - 10 reps - 5 hold - Seated Cervical Sidebending AROM  - 1 x daily - 7 x weekly - 1 sets - 10 reps - 5 hold - Seated Cervical Flexion AROM  - 1 x daily - 7 x weekly - 1 sets - 10 reps - 5 hold - Seated Cervical Extension AROM  - 1 x  daily - 7 x weekly - 1 sets - 10 reps - 5 hold - Seated Thoracic Extension Arms Overhead  - 1 x daily - 7 x weekly - 1-2 sets - 10 reps - 2-3 sec hold - Seated Trunk Rotation  - 1 x daily - 7 x weekly - 1-2 sets - 10 reps - 2-3 sec hold - Seated Diagonal Chops with Medicine Ball  - 1 x daily - 7 x weekly - 1-2 sets - 10 reps - 2-3 hold - Sidelying Thoracic Rotation with Open Book  - 1 x daily - 7 x weekly - 2 sets - 5 reps - Side Lunge Adductor Stretch  - 1 x daily - 7 x weekly - 1 sets - 10 reps - Standing Hamstring Stretch on Chair  -  1 x daily - 7 x weekly - 1 sets - 3 reps - 30 sec hold - Standing Quad Stretch with Table and Chair Support  - 1 x daily - 7 x weekly - 3 sets - 3 reps - 30 sec hold - Sit to Stand Without Arm Support  - 1 x daily - 7 x weekly - 2 sets - 10 reps - Squat with Chair Touch  - 1 x daily - 7 x weekly - 2 sets - 10 reps  GOALS: Goals reviewed with patient? Yes  SHORT TERM GOALS: Target date: 07/08/2023    The patient will demonstrate knowledge of basic exercises to promote improved soft tissue lengthening Baseline: Goal status: MET 07/01/23  2.  Improved cervical extension to 30 degrees, sidebending 15 degrees and rotation 50 degrees needed for driving Baseline: rotation 45 degrees Rt and Lt, Extension 30 degrees, Lt sidebending 15, Rt sidebending 15 degrees  Goal status: In progress  3.  Improved HS length to 60 degrees needed for picking up something from the floor with greater ease Baseline:  Goal status: INITIAL  4.  Able to do a partial squat to pick up something small from the floor without UE support with ease Baseline:  Goal status: INITIAL     LONG TERM GOALS: Target date: 08/12/2023   The patient will be independent in a safe self progression of a home exercise program to promote further recovery of function  Baseline:  Goal status: INITIAL  2.  Pt will have improved hip rotation mobility needed for putting socks on with greater ease with  PSFS score of 5 Baseline: 4 (07/15/23) Goal status: in progress   3.  Patient will have upper and lower body strength to get up/down off the floor with PSFS of 5 Baseline: 3 (no change-07/15/23) Goal status: In progress   4.  Patient will have improved spinal and hip mobility needed for getting in/out of the car with greater ease with PSFS score of 5 Baseline: 4 (07/15/23) Goal status: In progress   5.  The patient will have improved hip and core strength to at least 4+/5 needed for getting up and down off the floor and lifting medium to heavier objects Baseline:  Goal status: INITIAL   ASSESSMENT:  CLINICAL IMPRESSION: Pt reports that he is no longer working his part time job.  He has been working on regular flexibility at home. PSFS improved to 4/10 for getting in/out of the car and putting on socks.  No change in getting up/down from the floor so worked on single limb strength to address this.  He required verbal and tactile cues for technique.  He was able to add weight to sit to stand and advanced to staggered stance without weight.  Cervical A/ROM has improved in all directions. Patient will benefit from skilled PT to address the below impairments and improve overall function.   Eval: Patient is a 78 y.o. male who was seen today for physical therapy evaluation and treatment for mobility impairment. He demonstrates decreased soft tissue lengths and decreased joint mobility in multiple body regions including bil hips (particularly rotation), lumbar spine, cervical spine and in shoulders (particularly internal rotation). These limitations affect function including putting on his socks, getting in and out of the car, turning his head for driving, lifting his leg to step in/out of the hot tub and difficulty getting on and off the floor.  He would benefit from PT to establish a HEP to address these  impairments.  OBJECTIVE IMPAIRMENTS: decreased activity tolerance, decreased mobility, decreased  ROM, decreased strength, hypomobility, increased fascial restrictions, impaired perceived functional ability, and impaired flexibility.   ACTIVITY LIMITATIONS: bending, squatting, transfers, dressing, and hygiene/grooming  PARTICIPATION LIMITATIONS: driving, community activity, occupation, and dressing  PERSONAL FACTORS: Time since onset of injury/illness/exacerbation and 1 comorbidity: HTN  are also affecting patient's functional outcome.   REHAB POTENTIAL: Good  CLINICAL DECISION MAKING: Stable/uncomplicated  EVALUATION COMPLEXITY: Low  PLAN:  PT FREQUENCY: 2x/week  PT DURATION: 10 weeks (pt out of town 2 weeks)  PLANNED INTERVENTIONS: [phone]- PT Re-evaluation, 97110-Therapeutic exercises, 97530- Therapeutic activity, [phone]- Neuromuscular re-education, [phone]- Self Care, [id number]- Manual therapy, [phone]- Aquatic Therapy, Patient/Family education, Balance training, Stair training, Taping, Cryotherapy, and Moist heat; dry needling  PLAN FOR NEXT SESSION: D/C next visit per pt request, he will join a gym  Luella Sager, PT 07/15/23 9:33 AM  [facility] [address], [address] [place], [place] [address] Phone # [phone] Fax [phone]

## 2023-07-22 ENCOUNTER — Ambulatory Visit: Payer: Self-pay | Attending: Adult Health

## 2023-07-22 DIAGNOSIS — M25651 Stiffness of right hip, not elsewhere classified: Secondary | ICD-10-CM | POA: Diagnosis not present

## 2023-07-22 DIAGNOSIS — M6281 Muscle weakness (generalized): Secondary | ICD-10-CM | POA: Diagnosis not present

## 2023-07-22 DIAGNOSIS — R262 Difficulty in walking, not elsewhere classified: Secondary | ICD-10-CM | POA: Diagnosis not present

## 2023-07-22 DIAGNOSIS — M2560 Stiffness of unspecified joint, not elsewhere classified: Secondary | ICD-10-CM | POA: Diagnosis not present

## 2023-07-22 DIAGNOSIS — M25652 Stiffness of left hip, not elsewhere classified: Secondary | ICD-10-CM | POA: Diagnosis not present

## 2023-07-22 NOTE — Therapy (Signed)
 OUTPATIENT PHYSICAL THERAPY TREATMENT   Patient Name: Jon Summers MRN: [id number] DOB:[birth year], 78 y.o., male Today's Date: 07/22/2023   PCP: Alto Atta NP REFERRING PROVIDER: Alto Atta NP  END OF SESSION:  PT End of Session - 07/22/23 0857     Visit Number 5    Authorization Type BCBS medicare    PT Start Time [phone]    PT Stop Time 0928    PT Time Calculation (min) 41 min    Activity Tolerance Patient tolerated treatment well    Behavior During Therapy WFL for tasks assessed/performed                Past Medical History:  Diagnosis Date   Allergy    CAD (coronary artery disease)    From medical records in [place]    Chicken pox    Chronic kidney disease    From medical records in [place]    DJD (degenerative joint disease)    Hyperlipidemia    Hypertension    Prostate cancer (HCC)    Tortuous aorta (HCC)    From medical records in [place]    Past Surgical History:  Procedure Laterality Date   COLONOSCOPY     . 10 yrs ago   HERNIA REPAIR  1994   Patient Active Problem List   Diagnosis Date Noted   Malignant neoplasm of prostate (HCC) 08/03/2016   Essential hypertension 12/11/2014   Hyperlipidemia 12/11/2014    ONSET DATE: > 6 months  REFERRING DIAG: Z74.09 mobility impaired  THERAPY DIAG:  weakness  Rationale for Evaluation and Treatment: Rehabilitation  SUBJECTIVE:                                                                                                                                                                                             SUBJECTIVE STATEMENT: Ready to D/C.  I'm going to join a gym.   Golf 3x/week Sometimes walk with wife up to 5 miles   PERTINENT HISTORY: HTN  PAIN:  Are you having pain? No, stiffness with these activities   PRECAUTIONS: None  WEIGHT BEARING RESTRICTIONS: No  FALLS: Has patient fallen in last 6 months? No  LIVING ENVIRONMENT: Lives with: spouse Lives in:  House/apartment  PLOF: Independent  PATIENT GOALS: ease with putting on socks, improved getting in/out of the car, get in/out of the hot tub, turn head better when driving,   OBJECTIVE:  Note: Objective measures were completed at Evaluation unless otherwise noted.   COGNITION: Overall cognitive status: Within functional limits for tasks assessed    MUSCLE LENGTH: Hamstrings: Right 50 deg; Left 50 deg  Decreased hip flexor lengths bil: passively 10 degrees extension in sidelying            Decreased hip adductor length 35 degrees right, 30 degrees left  LOWER EXTREMITY ROM:   passive hip flexion to 90 degrees, passive internal rotation 0 degrees, passive external rotation 30 degrees bil  LOWER EXTREMITY MMT:   Hip abduction 4/5 bil; hip extension 4+/5 TRUNK STRENGTH:  Decreased activation of transverse abdominus muscles; abdominals 4-/5; decreased activation of lumbar multifidi; trunk extensors 4-/5   CERVICAL ROM: extension 20 degrees, right and left sidebending 8 degrees, right and left rotation 40 degrees 07/15/23: rotation 45 degrees Rt and Lt, Extension 30 degrees, Lt sidebending 15, Rt sidebending 15 degrees   Shoulder internal rotation limited with reach behind back to L5   GAIT: Comments: WFLs Able to rise from a standard height chair without UE assist Needs UE support to stoop down to pick up small object from the floor   PATIENT SURVEYS:  The Patient-Specific Functional Scale  Initial:  I am going to ask you to identify up to 3 important activities that you are unable to do or are having difficulty with as a result of this problem.  Today are there any activities that you are unable to do or having difficulty with because of this?  (Patient shown scale and patient rated each activity)  Follow up: When you first came in you had difficulty performing these activities.  Today do you still have difficulty?  Patient-Specific activity scoring scheme (Point to  one number):  0 1 2 3 4 5 6 7 8 9  10 Unable                                                                                                          Able to perform To perform                                                                                                    activity at the same Activity         Level as before  Injury or problem  Activity              Putting socks on                                                                   Initial:           3      07/15/23: 4       2.             In/out of the car                                                                       Initial:  3  07/15/23: 4                                                                                    3.              Getting up off the floor                                                           Initial:       2  07/15/23: 2                                                                                                                                              TREATMENT DATE:    07/22/23 Nustep L5 x 9 min-PT present to discuss progress Standing hamstring stretch 3 x 30 sec both-using power plate  Standing quad/hip flexor stretch 3 x 30 sec both-using power plate Seated piriformis stretch 3 x 30 sec both Sit to stand10#2 x10 Staggered stance x10 each  Leg press: seat 7 100# 2x10 Side lying open book x 10 each side Seated thoracic rotation x 5 each- tried thread the needle but too painful on knees  and wrists  07/15/23 Nustep L5 x 9 min (PT present to discuss status and educate on the effect of statins- patient had mentioned that the symptoms seem to have worsened when he started the new statin) Standing hamstring stretch 3 x 30 sec both Standing quad/hip flexor stretch 3 x 30 sec both Seated piriformis stretch 3 x 30 sec both Sit to stand x 10, 10# x10 Staggered  stance x10 each  Cervical A/ROM 3 ways x3 each  Quadruped thread the needle with thoracic rotation x 10 each side Side lying open book x 10 each side Seated thoracic rotation x 5 each- tried thread the needle but too painful on knees and wrists 07/01/23 Nustep L5 x 9 min (PT present to discuss status and educate on the effect of statins- patient had mentioned that the symptoms seem to have worsened when he started the new statin) Standing hamstring stretch 3 x 30 sec both Standing quad/hip flexor stretch 3 x 30 sec both Seated piriformis stretch 3 x 30 sec both Sit to stand x 10 Squat to mat table x 10 Quadruped thread the needle with thoracic rotation x 10 each side Side lying open book x 10 each side Reviewed all HEP and updated to add new stretches Educated patient on statins and encouraged to discuss options with MD   PATIENT EDUCATION: Education details: HEP update Person educated: Patient Education method: Explanation Education comprehension: verbalized understanding  HOME EXERCISE PROGRAM: Access Code: [email or web address] URL: [email or web address] Date: 07/01/2023 Prepared by: Aletha Anderson  Exercises - Supine Hip External Rotation Stretch  - 1 x daily - 7 x weekly - 1 sets - 3 reps - 20-30 hold - Supine Lower Trunk Rotation  - 1 x daily - 7 x weekly - 1 sets - 10 reps - 20-30 hold - Supine Double Knee to Chest  - 1 x daily - 7 x weekly - 1 sets - 3 reps - 20-30 hold - Quadruped Rocking Backward  - 1 x daily - 7 x weekly - 1 sets - 10 reps - Seated Figure 4 Piriformis Stretch  - 2 x daily - 7 x weekly - 1 sets - 3 reps - 30-60 sec hold - Seated Hamstring Stretch  - 1 x daily - 7 x weekly - 1 sets - 10 reps - no hold to start hold - Seated Cervical Rotation AROM  - 1 x daily - 7 x weekly - 1 sets - 10 reps - 5 hold - Seated Cervical Sidebending AROM  - 1 x daily - 7 x weekly - 1 sets - 10 reps - 5 hold - Seated Cervical Flexion AROM  - 1 x daily - 7 x weekly - 1 sets  - 10 reps - 5 hold - Seated Cervical Extension AROM  - 1 x daily - 7 x weekly - 1 sets - 10 reps - 5 hold - Seated Thoracic Extension Arms Overhead  - 1 x daily - 7 x weekly - 1-2 sets - 10 reps - 2-3 sec hold - Seated Trunk Rotation  - 1 x daily - 7 x weekly - 1-2 sets - 10 reps - 2-3 sec hold - Seated Diagonal Chops with Medicine Ball  - 1 x daily - 7 x weekly - 1-2 sets - 10 reps - 2-3 hold - Sidelying Thoracic Rotation with Open Book  - 1 x daily - 7 x weekly - 2 sets - 5 reps - Side Lunge Adductor Stretch  - 1 x daily -  7 x weekly - 1 sets - 10 reps - Standing Hamstring Stretch on Chair  - 1 x daily - 7 x weekly - 1 sets - 3 reps - 30 sec hold - Standing Quad Stretch with Table and Chair Support  - 1 x daily - 7 x weekly - 3 sets - 3 reps - 30 sec hold - Sit to Stand Without Arm Support  - 1 x daily - 7 x weekly - 2 sets - 10 reps - Squat with Chair Touch  - 1 x daily - 7 x weekly - 2 sets - 10 reps  GOALS: Goals reviewed with patient? Yes  SHORT TERM GOALS: Target date: 07/08/2023    The patient will demonstrate knowledge of basic exercises to promote improved soft tissue lengthening Baseline: Goal status: MET 07/01/23  2.  Improved cervical extension to 30 degrees, sidebending 15 degrees and rotation 50 degrees needed for driving Baseline: rotation 45 degrees Rt and Lt, Extension 30 degrees, Lt sidebending 15, Rt sidebending 15 degrees  Goal status: In progress  3.  Improved HS length to 60 degrees needed for picking up something from the floor with greater ease Baseline:  Goal status: INITIAL  4.  Able to do a partial squat to pick up something small from the floor without UE support with ease Baseline:  Goal status: MET     LONG TERM GOALS: Target date: 08/12/2023   The patient will be independent in a safe self progression of a home exercise program to promote further recovery of function  Baseline:  Goal status: MET  2.  Pt will have improved hip rotation mobility  needed for putting socks on with greater ease with PSFS score of 5 Baseline: 4 (07/15/23) Goal status: partially met  3.  Patient will have upper and lower body strength to get up/down off the floor with PSFS of 5 Baseline: 3 (no change-07/15/23) Goal status: partially met  4.  Patient will have improved spinal and hip mobility needed for getting in/out of the car with greater ease with PSFS score of 5 Baseline: 4 (07/15/23) Goal status: partially met   5.  The patient will have improved hip and core strength to at least 4+/5 needed for getting up and down off the floor and lifting medium to heavier objects Baseline:  Goal status: Not met   ASSESSMENT:  CLINICAL IMPRESSION: Pt will D/C today to HEP PSFS improved to 4/10 for getting in/out of the car and putting on socks.  Pt is challenged with getting objects off the floor and will continue to work on flexibility and strength to address. PT discussed gym exercises including NuStep/bike, Leg Press, and power plate. Pt will DC to HEP today.   Eval: Patient is a 78 y.o. male who was seen today for physical therapy evaluation and treatment for mobility impairment. He demonstrates decreased soft tissue lengths and decreased joint mobility in multiple body regions including bil hips (particularly rotation), lumbar spine, cervical spine and in shoulders (particularly internal rotation). These limitations affect function including putting on his socks, getting in and out of the car, turning his head for driving, lifting his leg to step in/out of the hot tub and difficulty getting on and off the floor.  He would benefit from PT to establish a HEP to address these impairments.  OBJECTIVE IMPAIRMENTS: decreased activity tolerance, decreased mobility, decreased ROM, decreased strength, hypomobility, increased fascial restrictions, impaired perceived functional ability, and impaired flexibility.   ACTIVITY LIMITATIONS: bending, squatting,  transfers,  dressing, and hygiene/grooming  PARTICIPATION LIMITATIONS: driving, community activity, occupation, and dressing  PERSONAL FACTORS: Time since onset of injury/illness/exacerbation and 1 comorbidity: HTN  are also affecting patient's functional outcome.   REHAB POTENTIAL: Good  CLINICAL DECISION MAKING: Stable/uncomplicated  EVALUATION COMPLEXITY: Low  PLAN: PHYSICAL THERAPY DISCHARGE SUMMARY  Visits from Start of Care: 5  Current functional level related to goals / functional outcomes: See above    Remaining deficits: Weakness and reduced lumbar and hip flexibility.  He plans to join [employer] and will continue with HEP   Education / Equipment: HEP, gym exercises    Patient agrees to discharge. Patient goals were partially met. Patient is being discharged due to being pleased with the current functional level.   Luella Sager, PT 07/22/23 9:30 AM  [facility] [address], [address] [place], [place] [address] Phone # [phone] Fax [phone]

## 2023-08-18 ENCOUNTER — Ambulatory Visit: Admitting: Adult Health

## 2023-12-16 ENCOUNTER — Ambulatory Visit: Payer: Self-pay | Admitting: *Deleted

## 2023-12-16 DIAGNOSIS — R2681 Unsteadiness on feet: Secondary | ICD-10-CM | POA: Diagnosis not present

## 2023-12-16 DIAGNOSIS — R42 Dizziness and giddiness: Secondary | ICD-10-CM | POA: Diagnosis not present

## 2023-12-16 NOTE — Telephone Encounter (Signed)
 Copied from CRM #[id number]. Topic: Clinical - Red Word Triage >> Dec 16, 2023  8:10 AM [place] A wrote: [facility] that prompted transfer to Nurse Triage: Patient said that he woke up with no balance. Reason for Disposition  SEVERE dizziness (e.g., unable to stand, requires support to walk, feels like passing out now)    Having to grab onto something to keep from falling.  I have no balance.  Answer Assessment - Initial Assessment Questions 1. DESCRIPTION: Describe your dizziness.     Not dizzy   If I walk 3-4 steps I have to stop and hold on to the wall.  I don't have any balance.   I noticed it at 2:00 AM and had to hold the wall.   This morning it's difficult to get around.   I can walk straight now but if I make a sudden turn I don't have any balance.   If I get up I have to hold on to something to keep from falling after sitting.    I'm foggy headed this morning.    No headaches.   No history of heart or stroke problems.  No numbness or weakness in extremities.   Denies numbness or tingling in face.   Speech  is clear.   I'm talking with you clearly.    It comes on and I have to grab the wall.    He is at work so asked a Cabin crew to look at his face.   Co worker told him he did not notice any drooping of his mouth, face or eyes.   [place] is appropriate.   Answering my questions clearly and appropriately and without difficulty.     2. LIGHTHEADED: Do you feel lightheaded? (e.g., somewhat faint, woozy, weak upon standing)     No feelings of passing out.    I'm close to [place].   Should I go there?  I let him know that wasn't an emergency room that he needed to be seen in an ED.  I'll go to the [facility] [place] ED on [address] now.  A co worker said he will take me.  It's 2 miles down the road from where I'm working. 3. VERTIGO: Do you feel like either you or the room is spinning or tilting? (i.e., vertigo)     It hits me suddenly I have no balance and I have to grab onto  something to keep from falling. 4. SEVERITY: How bad is it?  Do you feel like you are going to faint? Can you stand and walk?     Severe earlier this morning at 2:00 AM and when I first got up this morning.    5. ONSET:  When did the dizziness begin?     2:00 AM this morning and again when I got up this morning I had to hold onto the wall to keep from falling.    6. AGGRAVATING FACTORS: Does anything make it worse? (e.g., standing, change in head position)     Sudden moves and getting up from sitting 7. HEART RATE: Can you tell me your heart rate? How many beats in 15 seconds?  (Note: Not all patients can do this.)       Not asked 8. CAUSE: What do you think is causing the dizziness? (e.g., decreased fluids or food, diarrhea, emotional distress, heat exposure, new medicine, sudden standing, vomiting; unknown)     I don't know.   I just don't have any balance  9. RECURRENT SYMPTOM: Have you had dizziness before? If Yes, ask: When was the last time? What happened that time?     Not asked   Referred on to the ED 10. OTHER SYMPTOMS: Do you have any other symptoms? (e.g., fever, chest pain, vomiting, diarrhea, bleeding)       See above    Denies any other symptoms other than feeling foggy headed. 11. PREGNANCY: Is there any chance you are pregnant? When was your last menstrual period?       N/A  Protocols used: Dizziness - Lightheadedness-A-AH FYI Only or Action Required?: FYI only for provider.  Patient was last seen in primary care on 05/27/2023 by Merna Huxley, NP.  Called Nurse Triage reporting Dizziness.I have no balance.   I have to grab onto something to keep from falling.  Symptoms began today.2:00 AM this morning and again when he got up this morning.  Interventions attempted: Other: Grabbing onto things to keep from falling.   Is a little better now.  Symptoms are: gradually improving.Not as off balance as I was but foggy headed.  Triage Disposition:  Go to ED Now (or PCP Triage) A co worker is taking him to the [facility] ED on [address] now.  Patient/caregiver understands and will follow disposition?: Yes

## 2023-12-21 ENCOUNTER — Encounter: Payer: Self-pay | Admitting: Adult Health

## 2023-12-21 ENCOUNTER — Ambulatory Visit (INDEPENDENT_AMBULATORY_CARE_PROVIDER_SITE_OTHER): Admitting: Adult Health

## 2023-12-21 VITALS — BP 108/60 | HR 52 | Temp 98.4°F | Ht 70.0 in | Wt 226.0 lb

## 2023-12-21 DIAGNOSIS — Z23 Encounter for immunization: Secondary | ICD-10-CM | POA: Diagnosis not present

## 2023-12-21 DIAGNOSIS — R42 Dizziness and giddiness: Secondary | ICD-10-CM | POA: Diagnosis not present

## 2023-12-21 NOTE — Progress Notes (Signed)
 Subjective:    Patient ID: Jon Summers, male    DOB: 09-Aug-1945, 78 y.o.   MRN: [id number]  HPI 78 year old male who  has a past medical history of Allergy, CAD (coronary artery disease), Chicken pox, Chronic kidney disease, DJD (degenerative joint disease), Hyperlipidemia, Hypertension, Prostate cancer (HCC), and Tortuous aorta.  He presents to the office today for follow-up after being seen in the emergency room at [facility].  Per ER note he woke up about 2:00 in the morning the day of presentation and felt off balance like he had to hold onto the wall.  He did not have any nausea or pain.  He did not have any syncopal episode.  He denied focal neurologic complaints such as numbness, tingling, or weakness or visual changes.  In the ER he was noted to have horizontal nystagmus but no other symptoms.  Dizziness could not be reproduced with movement of the head.  EKG did show sinus bradycardia.   His CT scan showed no acute infarct, lesion, or intracranial hemorrhage.  Did note paranasal sinus disease.  Today he reports that his feeling of off balance improved the following day and then resolved and he has not had any more issues. He denies sinus pain/pressure, ear pain, dizziness, lightheadedness or nasal congestion.    Review of Systems See HPI   Past Medical History:  Diagnosis Date   Allergy    CAD (coronary artery disease)    From medical records in [place]    Chicken pox    Chronic kidney disease    From medical records in [place]    DJD (degenerative joint disease)    Hyperlipidemia    Hypertension    Prostate cancer (HCC)    Tortuous aorta    From medical records in [place]     Social History   Socioeconomic History   Marital status: Married    Spouse name: Not on file   Number of children: 0   Years of education: Not on file   Highest education level: Associate degree: occupational, Scientist, product/process development, or vocational program  Occupational History   Occupation: Part time  transportation  Tobacco Use   Smoking status: Former    Current packs/day: 0.00    Average packs/day: 1 pack/day for 25.0 years (25.0 ttl pk-yrs)    Types: Cigarettes    Start date: 03/23/1962    Quit date: 03/24/1987    Years since quitting: 36.7   Smokeless tobacco: Never  Vaping Use   Vaping status: Never Used  Substance and Sexual Activity   Alcohol use: Yes    Alcohol/week: 0.0 standard drinks of alcohol    Comment: Socially; one drink per week.   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    Married   No biological children   Social Drivers of Health   Financial Resource Strain: Patient Declined (04/27/2023)   Overall Financial Resource Strain (CARDIA)    Difficulty of Paying Living Expenses: Patient declined  Food Insecurity: Patient Declined (04/27/2023)   Hunger Vital Sign    Worried About Running Out of Food in the Last Year: Patient declined    Ran Out of Food in the Last Year: Patient declined  Transportation Needs: Patient Declined (04/27/2023)   PRAPARE - Administrator, Civil Service (Medical): Patient declined  Lack of Transportation (Non-Medical): Patient declined  Physical Activity: Insufficiently Active (04/27/2023)   Exercise Vital Sign    Days of Exercise per Week: 1 day    Minutes of Exercise per Session: 30 min  Stress: No Stress Concern Present (04/27/2023)   [employer] of Occupational Health - Occupational Stress Questionnaire    Feeling of Stress : Not at all  Social Connections: Unknown (04/27/2023)   Social Connection and Isolation Panel    Frequency of Communication with Friends and Family: Patient declined    Frequency of Social Gatherings with Friends and Family: Patient declined    Attends Religious Services: Patient declined    Database administrator or Organizations: Patient declined    Attends  Banker Meetings: Not on file    Marital Status: Patient declined  Recent Concern: Social Connections - Moderately Isolated (02/07/2023)   Social Connection and Isolation Panel    Frequency of Communication with Friends and Family: More than three times a week    Frequency of Social Gatherings with Friends and Family: Three times a week    Attends Religious Services: Never    Active Member of Clubs or Organizations: No    Attends Banker Meetings: Not on file    Marital Status: Married  Intimate Partner Violence: Not At Risk (12/16/2023)   Received from [facility]   HITS    Over the last 12 months how often did your partner physically hurt you?: Never    Over the last 12 months how often did your partner insult you or talk down to you?: Never    Over the last 12 months how often did your partner threaten you with physical harm?: Never    Over the last 12 months how often did your partner scream or curse at you?: Never    Past Surgical History:  Procedure Laterality Date   COLONOSCOPY     . 10 yrs ago   HERNIA REPAIR  1994    Family History  Problem Relation Age of Onset   Dementia Mother    Miscarriages / [place] Mother    Stroke Father        From dental visit   Prostate cancer Father    Cancer Father 42       prostate, treated with radiation   Lung cancer Paternal Uncle    Cancer Paternal Uncle        unknown/heavy smoker   Colon cancer Neg Hx    Colon polyps Neg Hx    Esophageal cancer Neg Hx    Rectal cancer Neg Hx    Stomach cancer Neg Hx     No Known Allergies  Current Outpatient Medications on File Prior to Visit  Medication Sig Dispense Refill   aspirin 81 MG tablet Take 81 mg by mouth daily.     benazepril -hydrochlorthiazide (LOTENSIN  HCT) 20-25 MG tablet Take 1 tablet by mouth daily. 90 tablet 3   rosuvastatin  (CRESTOR ) 10 MG tablet Take 1 tablet (10 mg total) by mouth daily. 90 tablet 3   No current facility-administered  medications on file prior to visit.    BP 108/60   Pulse (!) 52   Temp 98.4 F (36.9 C) (Oral)   Ht 5' 10 (1.778 m)   Wt 226 lb (102.5 kg)   SpO2 96%   BMI 32.43 kg/m       Objective:   Physical Exam Vitals and nursing note reviewed.  Constitutional:  Appearance: Normal appearance.  HENT:     Right Ear: Tympanic membrane, ear canal and external ear normal. There is no impacted cerumen.     Left Ear: Tympanic membrane, ear canal and external ear normal. There is no impacted cerumen.     Nose: Nose normal. No congestion or rhinorrhea.     Mouth/Throat:     Mouth: Mucous membranes are moist.     Pharynx: Oropharynx is clear.  Eyes:     Extraocular Movements: Extraocular movements intact.     Right eye: No nystagmus.     Left eye: No nystagmus.     Conjunctiva/sclera: Conjunctivae normal.     Pupils: Pupils are equal, round, and reactive to light.  Cardiovascular:     Rate and Rhythm: Normal rate and regular rhythm.     Pulses: Normal pulses.     Heart sounds: Normal heart sounds.  Pulmonary:     Effort: Pulmonary effort is normal.     Breath sounds: Normal breath sounds.  Musculoskeletal:        General: Normal range of motion.  Skin:    General: Skin is warm and dry.  Neurological:     General: No focal deficit present.     Mental Status: He is alert and oriented to person, place, and time.  Psychiatric:        Mood and Affect: Mood normal.        Behavior: Behavior normal.        Judgment: Judgment normal.        Assessment & Plan:  1. Dizziness (Primary) - Symptoms seemed to have resolved. Possible mild BPPV vs allergies - Follow up if symptoms resolve   2. Need for influenza vaccination  - Flu vaccine HIGH DOSE PF(Fluzone Trivalent)  Bennetta Rudden, NP

## 2023-12-31 ENCOUNTER — Ambulatory Visit
Admission: EM | Admit: 2023-12-31 | Discharge: 2023-12-31 | Disposition: A | Discharge status: Home / Self Care | Attending: Internal Medicine | Admitting: Internal Medicine

## 2023-12-31 VITALS — BP 142/60 | HR 55 | Temp 97.5°F | Resp 17

## 2023-12-31 DIAGNOSIS — Z87891 Personal history of nicotine dependence: Secondary | ICD-10-CM

## 2023-12-31 DIAGNOSIS — J069 Acute upper respiratory infection, unspecified: Secondary | ICD-10-CM

## 2023-12-31 LAB — POC SOFIA SARS ANTIGEN FIA: SARS Coronavirus 2 Ag: NEGATIVE

## 2023-12-31 MED ORDER — ALBUTEROL SULFATE HFA 108 (90 BASE) MCG/ACT IN AERS: 1.0000 | INHALATION_SPRAY | Freq: Four times a day (QID) | RESPIRATORY_TRACT | 0 refills | Status: AC | PRN

## 2023-12-31 MED ORDER — GUAIFENESIN ER 600 MG PO TB12: 600.0000 mg | ORAL_TABLET | Freq: Two times a day (BID) | ORAL | 0 refills | Status: DC

## 2023-12-31 NOTE — ED Triage Notes (Signed)
 Pt c/o congestion and cough for 1 day.

## 2023-12-31 NOTE — Discharge Instructions (Addendum)

## 2023-12-31 NOTE — ED Provider Notes (Signed)
 [facility]    CSN: [id number] Arrival date & time: 12/31/23  1643      History   Chief Complaint Chief Complaint  Patient presents with   Cough   Nasal Congestion    HPI Jon Summers is a 78 y.o. male.   Jon Summers is a 78 y.o. male presenting for chief complaint of Cough and Nasal Congestion that started yesterday (December 30, 2023). Recently around someone last week with similar symptoms. Cough is minimally productive with yellow sputum. Denies shortness of breath, chest pain, palpitations, nausea, vomiting, diarrhea, rashes, and fever/chills. Former cigarette smoker, quit smoking 40 years ago. Denies history of bronchitis.  Taking over-the-counter decongestant Tylenol which she states is not making it worse.   Cough   Past Medical History:  Diagnosis Date   Allergy    CAD (coronary artery disease)    From medical records in [place]    Chicken pox    Chronic kidney disease    From medical records in [place]    DJD (degenerative joint disease)    Hyperlipidemia    Hypertension    Prostate cancer (HCC)    Tortuous aorta    From medical records in [place]     Patient Active Problem List   Diagnosis Date Noted   Malignant neoplasm of prostate (HCC) 08/03/2016   Essential hypertension 12/11/2014   Hyperlipidemia 12/11/2014    Past Surgical History:  Procedure Laterality Date   COLONOSCOPY     . 10 yrs ago   HERNIA REPAIR  1994       Home Medications    Prior to Admission medications   Medication Sig Start Date End Date Taking? Authorizing Provider  albuterol (VENTOLIN HFA) 108 (90 Base) MCG/ACT inhaler Inhale 1-2 puffs into the lungs every 6 (six) hours as needed for wheezing or shortness of breath. 12/31/23  Yes Enedelia Dorna HERO, FNP  guaiFENesin (MUCINEX) 600 MG 12 hr tablet Take 1 tablet (600 mg total) by mouth 2 (two) times daily. 12/31/23  Yes Enedelia Dorna HERO, FNP  aspirin 81 MG tablet Take 81 mg by mouth daily.    [provider]  benazepril -hydrochlorthiazide (LOTENSIN  HCT) 20-25 MG tablet Take 1 tablet by mouth daily. 02/11/23   Nafziger, Darleene, NP  rosuvastatin  (CRESTOR ) 10 MG tablet Take 1 tablet (10 mg total) by mouth daily. 02/11/23   Nafziger, Darleene, NP    Family History Family History  Problem Relation Age of Onset   Dementia Mother    Miscarriages / [place] Mother    Stroke Father        From dental visit   Prostate cancer Father    Cancer Father 33       prostate, treated with radiation   Lung cancer Paternal Uncle    Cancer Paternal Uncle        unknown/heavy smoker   Colon cancer Neg Hx    Colon polyps Neg Hx    Esophageal cancer Neg Hx    Rectal cancer Neg Hx    Stomach cancer Neg Hx     Social History Social History   Tobacco Use   Smoking status: Former    Current packs/day: 0.00    Average packs/day: 1 pack/day for 25.0 years (25.0 ttl pk-yrs)    Types: Cigarettes    Start date: 03/23/1962    Quit date: 03/24/1987    Years since quitting: 36.7   Smokeless tobacco: Never  Vaping Use   Vaping status: Never Used  Substance  Use Topics   Alcohol use: Yes    Alcohol/week: 0.0 standard drinks of alcohol    Comment: Socially; one drink per week.   Drug use: No     Allergies   Patient has no known allergies.   Review of Systems Review of Systems  Respiratory:  Positive for cough.   Per HPI   Physical Exam Triage Vital Signs ED Triage Vitals  Encounter Vitals Group     BP 12/31/23 1715 (!) 142/60     Girls Systolic BP Percentile --      Girls Diastolic BP Percentile --      Boys Systolic BP Percentile --      Boys Diastolic BP Percentile --      Pulse Rate 12/31/23 1715 (!) 55     Resp 12/31/23 1715 17     Temp 12/31/23 1715 (!) 97.5 F (36.4 C)     Temp Source 12/31/23 1715 Oral     SpO2 12/31/23 1715 95 %     Weight --      Height --      Head Circumference --      Peak Flow --      Pain Score 12/31/23 1719 0     Pain Loc --      Pain Education  --      Exclude from Growth Chart --    No data found.  Updated Vital Signs BP (!) 142/60 (BP Location: Right Arm)   Pulse (!) 55   Temp (!) 97.5 F (36.4 C) (Oral)   Resp 17   SpO2 95%   Visual Acuity Right Eye Distance:   Left Eye Distance:   Bilateral Distance:    Right Eye Near:   Left Eye Near:    Bilateral Near:     Physical Exam Vitals and nursing note reviewed.  Constitutional:      Appearance: He is not ill-appearing or toxic-appearing.  HENT:     Head: Normocephalic and atraumatic.     Right Ear: Hearing, tympanic membrane, ear canal and external ear normal.     Left Ear: Hearing, tympanic membrane, ear canal and external ear normal.     Nose: Nose normal.     Mouth/Throat:     Lips: Pink.     Mouth: Mucous membranes are moist. No injury or oral lesions.     Dentition: Normal dentition.     Tongue: No lesions.     Pharynx: Oropharynx is clear. Uvula midline. No pharyngeal swelling, oropharyngeal exudate, posterior oropharyngeal erythema, uvula swelling or postnasal drip.     Tonsils: No tonsillar exudate.  Eyes:     General: Lids are normal. Vision grossly intact. Gaze aligned appropriately.     Extraocular Movements: Extraocular movements intact.     Conjunctiva/sclera: Conjunctivae normal.  Neck:     Trachea: Trachea and phonation normal.  Cardiovascular:     Rate and Rhythm: Normal rate and regular rhythm.     Heart sounds: Normal heart sounds, S1 normal and S2 normal.  Pulmonary:     Effort: Pulmonary effort is normal. No respiratory distress.     Breath sounds: Normal breath sounds and air entry. No wheezing, rhonchi or rales.     Comments: Speaks in full sentences without difficulty.  Coarse breath sounds throughout.  Nonfocal lung exam. Chest:     Chest wall: No tenderness.  Musculoskeletal:     Cervical back: Neck supple.  Lymphadenopathy:     Cervical: No cervical adenopathy.  Skin:    General: Skin is warm and dry.     Capillary Refill:  Capillary refill takes less than 2 seconds.     Findings: No rash.  Neurological:     General: No focal deficit present.     Mental Status: He is alert and oriented to person, place, and time. Mental status is at baseline.     Cranial Nerves: No dysarthria or facial asymmetry.  Psychiatric:        Mood and Affect: Mood normal.        Speech: Speech normal.        Behavior: Behavior normal.        Thought Content: Thought content normal.        Judgment: Judgment normal.      UC Treatments / Results  Labs (all labs ordered are listed, but only abnormal results are displayed) Labs Reviewed  POC SOFIA SARS ANTIGEN FIA    EKG   Radiology No results found.  Procedures Procedures (including critical care time)  Medications Ordered in UC Medications - No data to display  Initial Impression / Assessment and Plan / UC Course  I have reviewed the triage vital signs and the nursing notes.  Pertinent labs & imaging results that were available during my care of the patient were reviewed by me and considered in my medical decision making (see chart for details).   1.  Viral URI with cough, former smoker Suspect viral URI, viral syndrome.  Strep/viral testing: Point-of-care COVID-19 testing is negative.  Coarse breath sounds throughout, low suspicion for acute cardiopulmonary abnormality. He is overall well-appearing with hemodynamically stable vital signs.  Patient would benefit from as needed use of albuterol inhaler for cough.  2 puffs every 4-6 hours as needed recommended.  Patient requests antibiotic today, we discussed indications for antibiotics and use of supportive care to treat viral upper respiratory infections.  No signs of bacterial infection on exam.    Advised supportive care/prescriptions for symptomatic relief as outlined in AVS.    Counseled patient on potential for adverse effects with medications prescribed/recommended today, strict ER and return-to-clinic  precautions discussed, patient verbalized understanding.   Final Clinical Impressions(s) / UC Diagnoses   Final diagnoses:  Viral URI with cough  Former smoker     Discharge Instructions      You have a viral illness which will improve on its own with rest, fluids, and medications to help with your symptoms.  Tylenol, guaifenesin (plain mucinex), and saline nasal sprays may help relieve symptoms.   Two teaspoons of honey in 1 cup of warm water every 4-6 hours may help with throat pains.  Humidifier in room at nighttime may help soothe cough (clean well daily).   Take Promethazine DM cough medication to help with your cough at nighttime so that you are able to sleep. Do not drive, drink alcohol, or go to work while taking this medication since it can make you sleepy. Only take this at nighttime.   For chest pain, shortness of breath, inability to keep food or fluids down without vomiting, fever that does not respond to tylenol or motrin, or any other severe symptoms, please go to the ER for further evaluation. Return to urgent care as needed, otherwise follow-up with PCP.      ED Prescriptions     Medication Sig Dispense Auth. Provider   albuterol (VENTOLIN HFA) 108 (90 Base) MCG/ACT inhaler Inhale 1-2 puffs into the lungs every 6 (six) hours as  needed for wheezing or shortness of breath. 18 g Enedelia Going M, FNP   guaiFENesin (MUCINEX) 600 MG 12 hr tablet Take 1 tablet (600 mg total) by mouth 2 (two) times daily. 20 tablet Enedelia Going HERO, FNP      PDMP not reviewed this encounter.   Enedelia Going HERO, [place] 12/31/23 [place]

## 2024-01-20 ENCOUNTER — Other Ambulatory Visit: Payer: Self-pay | Admitting: Adult Health

## 2024-01-20 DIAGNOSIS — I10 Essential (primary) hypertension: Secondary | ICD-10-CM

## 2024-02-04 ENCOUNTER — Other Ambulatory Visit: Payer: Self-pay | Admitting: Adult Health

## 2024-03-03 ENCOUNTER — Other Ambulatory Visit: Payer: Self-pay | Admitting: Adult Health

## 2024-03-08 NOTE — Telephone Encounter (Signed)
 Pt needs to schedule a CPE for further refills.

## 2024-03-28 ENCOUNTER — Ambulatory Visit: Admitting: Adult Health

## 2024-03-28 VITALS — BP 126/60 | HR 49 | Temp 98.0°F | Ht 70.0 in | Wt 226.0 lb

## 2024-03-28 DIAGNOSIS — E66811 Obesity, class 1: Secondary | ICD-10-CM

## 2024-03-28 DIAGNOSIS — Z0000 Encounter for general adult medical examination without abnormal findings: Secondary | ICD-10-CM

## 2024-03-28 DIAGNOSIS — Z8546 Personal history of malignant neoplasm of prostate: Secondary | ICD-10-CM | POA: Diagnosis not present

## 2024-03-28 DIAGNOSIS — E785 Hyperlipidemia, unspecified: Secondary | ICD-10-CM | POA: Diagnosis not present

## 2024-03-28 DIAGNOSIS — Z6832 Body mass index (BMI) 32.0-32.9, adult: Secondary | ICD-10-CM | POA: Diagnosis not present

## 2024-03-28 DIAGNOSIS — I10 Essential (primary) hypertension: Secondary | ICD-10-CM

## 2024-03-28 MED ORDER — BENAZEPRIL-HYDROCHLOROTHIAZIDE 20-25 MG PO TABS: 1.0000 | ORAL_TABLET | Freq: Every day | ORAL | 0 refills | Status: DC

## 2024-03-28 MED ORDER — BENAZEPRIL-HYDROCHLOROTHIAZIDE 20-25 MG PO TABS: 1.0000 | ORAL_TABLET | Freq: Every day | ORAL | 3 refills | Status: AC

## 2024-03-28 NOTE — Progress Notes (Signed)
 "  Subjective:    Patient ID: Jon Summers, male    DOB: [birth year], 79 y.o.   MRN: [id number]  HPI Patient presents for yearly preventative medicine examination. He is a pleasant 79 year old male who  has a past medical history of Allergy, CAD (coronary artery disease), Chicken pox, Chronic kidney disease, DJD (degenerative joint disease), Hyperlipidemia, Hypertension, Prostate cancer (HCC), and Tortuous aorta.  Hypertension - managed with Lotensin  HCT 20-25 mg. He denies dizziness, lightheaded, or blurred vision  BP Readings from Last 3 Encounters:  03/28/24 126/60  12/31/23 (!) 142/60  12/21/23 108/60   Hyperlipidemia - He stopped Crestor  about 2 months ago due to myalgia throughout this body but started in his legs. Once he stopped the medication his symptoms resolved  Lab Results  Component Value Date   CHOL 177 02/11/2023   HDL 44.00 02/11/2023   LDLCALC 122 (H) 02/11/2023   TRIG 55.0 02/11/2023   CHOLHDL 4 02/11/2023   History of Prostate Cancer - treated in 2018 with radiation therapy. He does not see urology any longer Lab Results  Component Value Date   PSA 0.51 02/11/2023   PSA 0.54 11/14/2021   PSA 0.4 11/16/2019    Obesity - he is interested in starting GLP-1 therapy for weight loss. He does try and stay active and eat healthy but is unable to lose weight.    All immunizations and health maintenance protocols were reviewed with the patient and needed orders were placed. He will get his second shingles vaccination at the pharmacy   Appropriate screening laboratory values were ordered for the patient including screening of hyperlipidemia, renal function and hepatic function. If indicated by BPH, a PSA was ordered.  Medication reconciliation,  past medical history, social history, problem list and allergies were reviewed in detail with the patient  Goals were established with regard to weight loss, exercise, and  diet in compliance with medications  Wt Readings  from Last 3 Encounters:  03/28/24 226 lb (102.5 kg)  12/21/23 226 lb (102.5 kg)  05/27/23 221 lb (100.2 kg)    Review of Systems  Constitutional: Negative.   HENT: Negative.    Eyes: Negative.   Respiratory: Negative.    Cardiovascular: Negative.   Gastrointestinal: Negative.   Endocrine: Negative.   Genitourinary: Negative.   Musculoskeletal: Negative.   Skin: Negative.   Allergic/Immunologic: Negative.   Neurological: Negative.   Hematological: Negative.   Psychiatric/Behavioral: Negative.    All other systems reviewed and are negative.    Past Medical History:  Diagnosis Date   Allergy    CAD (coronary artery disease)    From medical records in [place]    Chicken pox    Chronic kidney disease    From medical records in [place]    DJD (degenerative joint disease)    Hyperlipidemia    Hypertension    Prostate cancer (HCC)    Tortuous aorta    From medical records in [place]     Social History   Socioeconomic History   Marital status: Married    Spouse name: Not on file   Number of children: 0   Years of education: Not on file   Highest education level: Associate degree: occupational, scientist, product/process development, or vocational program  Occupational History   Occupation: Part time transportation  Tobacco Use   Smoking status: Former    Current packs/day: 0.00    Average packs/day: 1 pack/day for 25.0 years (25.0 ttl pk-yrs)    Types: Cigarettes  Start date: 03/23/1962    Quit date: 03/24/1987    Years since quitting: 37.0   Smokeless tobacco: Never  Vaping Use   Vaping status: Never Used  Substance and Sexual Activity   Alcohol use: Yes    Alcohol/week: 0.0 standard drinks of alcohol    Comment: Socially; one drink per week.   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    Married   No biological children   Social Drivers of Health    Tobacco Use: Medium Risk (12/31/2023)   Patient History    Smoking Tobacco Use: Former    Smokeless Tobacco Use: Never    Passive Exposure: Not on file  Financial Resource Strain: Low Risk (03/24/2024)   Overall Financial Resource Strain (CARDIA)    Difficulty of Paying Living Expenses: Not hard at all  Food Insecurity: No Food Insecurity (03/24/2024)   Epic    Worried About Programme Researcher, Broadcasting/film/video in the Last Year: Never true    Ran Out of Food in the Last Year: Never true  Transportation Needs: Patient Declined (03/24/2024)   Epic    Lack of Transportation (Medical): Patient declined    Lack of Transportation (Non-Medical): Patient declined  Physical Activity: Inactive (03/24/2024)   Exercise Vital Sign    Days of Exercise per Week: 0 days    Minutes of Exercise per Session: Not on file  Stress: No Stress Concern Present (03/24/2024)   [employer] of Occupational Health - Occupational Stress Questionnaire    Feeling of Stress: Not at all  Social Connections: Unknown (03/24/2024)   Social Connection and Isolation Panel    Frequency of Communication with Friends and Family: Twice a week    Frequency of Social Gatherings with Friends and Family: Once a week    Attends Religious Services: Patient declined    Database Administrator or Organizations: Patient declined    Attends Banker Meetings: Not on file    Marital Status: Married  Intimate Partner Violence: Not At Risk (12/16/2023)   Received from [facility]   HITS    Over the last 12 months how often did your partner physically hurt you?: Never    Over the last 12 months how often did your partner insult you or talk down to you?: Never    Over the last 12 months how often did your partner threaten you with physical harm?: Never    Over the last 12 months how often did your partner scream or curse at you?: Never  Depression (PHQ2-9): Low Risk (02/11/2023)   Depression (PHQ2-9)    PHQ-2 Score: 0  Alcohol Screen: Low  Risk (03/24/2024)   Alcohol Screen    Last Alcohol Screening Score (AUDIT): 1  Housing: Unknown (03/24/2024)   Epic    Unable to Pay for Housing in the Last Year: No    Number of Times Moved in the Last Year: Not on file    Homeless in the Last Year: No  Utilities: Not At Risk (12/24/2021)   [facility] Utilities    Threatened with loss of utilities: No  Health Literacy: Not on file    Past Surgical History:  Procedure Laterality Date   COLONOSCOPY     . 10 yrs ago   HERNIA REPAIR  1994    Family History  Problem  Relation Age of Onset   Dementia Mother    Miscarriages / Stillbirths Mother    Stroke Father        From dental visit   Prostate cancer Father    Cancer Father 24       prostate, treated with radiation   Lung cancer Paternal Uncle    Cancer Paternal Uncle        unknown/heavy smoker   Colon cancer Neg Hx    Colon polyps Neg Hx    Esophageal cancer Neg Hx    Rectal cancer Neg Hx    Stomach cancer Neg Hx     Allergies[1]  Medications Ordered Prior to Encounter[2]  BP 126/60   Pulse (!) 49   Temp 98 F (36.7 C) (Oral)   Ht 5' 10 (1.778 m)   Wt 226 lb (102.5 kg)   SpO2 97%   BMI 32.43 kg/m       Objective:   Physical Exam Vitals and nursing note reviewed.  Constitutional:      General: He is not in acute distress.    Appearance: Normal appearance. He is obese. He is not ill-appearing.  HENT:     Head: Normocephalic and atraumatic.     Right Ear: Tympanic membrane, ear canal and external ear normal. There is no impacted cerumen.     Left Ear: Tympanic membrane, ear canal and external ear normal. There is no impacted cerumen.     Nose: Nose normal. No congestion or rhinorrhea.     Mouth/Throat:     Mouth: Mucous membranes are moist.     Pharynx: Oropharynx is clear.  Eyes:     Extraocular Movements: Extraocular movements intact.     Conjunctiva/sclera: Conjunctivae normal.     Pupils: Pupils are equal, round, and reactive to light.  Neck:      Vascular: No carotid bruit.  Cardiovascular:     Rate and Rhythm: Normal rate and regular rhythm.     Pulses: Normal pulses.     Heart sounds: No murmur heard.    No friction rub. No gallop.  Pulmonary:     Effort: Pulmonary effort is normal.     Breath sounds: Normal breath sounds.  Abdominal:     General: Abdomen is flat. Bowel sounds are normal. There is no distension.     Palpations: Abdomen is soft. There is no mass.     Tenderness: There is no abdominal tenderness. There is no guarding or rebound.     Hernia: No hernia is present.  Musculoskeletal:        General: Normal range of motion.     Cervical back: Normal range of motion and neck supple.  Lymphadenopathy:     Cervical: No cervical adenopathy.  Skin:    General: Skin is warm and dry.     Capillary Refill: Capillary refill takes less than 2 seconds.  Neurological:     General: No focal deficit present.     Mental Status: He is alert and oriented to person, place, and time.  Psychiatric:        Mood and Affect: Mood normal.        Behavior: Behavior normal.        Thought Content: Thought content normal.        Judgment: Judgment normal.        Assessment & Plan:  1. Routine general medical examination at a health care facility (Primary) Today patient counseled on age appropriate routine health concerns  for screening and prevention, each reviewed and up to date or declined. Immunizations reviewed and up to date or declined. Labs ordered and reviewed. Risk factors for depression reviewed and negative. Hearing function and visual acuity are intact. ADLs screened and addressed as needed. Functional ability and level of safety reviewed and appropriate. Education, counseling and referrals performed based on assessed risks today. Patient provided with a copy of personalized plan for preventive services. - Follow up in one year or sooner if needed  2. Essential hypertension - Controlled. No change in therapy -  benazepril -hydrochlorthiazide (LOTENSIN  HCT) 20-25 MG tablet; Take 1 tablet by mouth daily.  Dispense: 90 tablet; Refill: 3 - Lipid panel; Future - TSH; Future - CBC; Future - Comprehensive metabolic panel with GFR; Future - Comprehensive metabolic panel with GFR - TSH - CBC - Lipid panel  3. Hyperlipidemia, unspecified hyperlipidemia type - Likely need to try a different statin  - Lipid panel; Future - TSH; Future - CBC; Future - Comprehensive metabolic panel with GFR; Future - Comprehensive metabolic panel with GFR - TSH - CBC - Lipid panel  4. History of prostate cancer  - PSA; Future - PSA  5. Class 1 obesity - we discussed the different GLP-1 one medications for weight loss. His insurance does not cover them at this time. He is interest in self pay but does not want to pay the current price. Since the [facility] pill recently came out he is interested in this.  - Will have him come back in a month when we have more info on the medication  - Work on exercise and diet   6. BMI 32.0-32.9,adult - See above    Darleene Shape, NP      [1] No Known Allergies [2]  Current Outpatient Medications on File Prior to Visit  Medication Sig Dispense Refill   albuterol  (VENTOLIN  HFA) 108 (90 Base) MCG/ACT inhaler Inhale 1-2 puffs into the lungs every 6 (six) hours as needed for wheezing or shortness of breath. 18 g 0   aspirin 81 MG tablet Take 81 mg by mouth daily.     No current facility-administered medications on file prior to visit.   "

## 2024-03-29 ENCOUNTER — Ambulatory Visit: Payer: Self-pay | Admitting: Adult Health

## 2024-03-29 LAB — CBC
HCT: 39.3 % (ref 39.0–52.0)
Hemoglobin: 13.1 g/dL (ref 13.0–17.0)
MCHC: 33.4 g/dL (ref 30.0–36.0)
MCV: 92.8 fl (ref 78.0–100.0)
Platelets: 244 K/uL (ref 150.0–400.0)
RBC: 4.23 Mil/uL (ref 4.22–5.81)
RDW: 13.7 % (ref 11.5–15.5)
WBC: 7.7 K/uL (ref 4.0–10.5)

## 2024-03-29 LAB — COMPREHENSIVE METABOLIC PANEL WITH GFR
ALT: 13 U/L (ref 3–53)
AST: 14 U/L (ref 5–37)
Albumin: 4.2 g/dL (ref 3.5–5.2)
Alkaline Phosphatase: 61 U/L (ref 39–117)
BUN: 22 mg/dL (ref 6–23)
CO2: 30 meq/L (ref 19–32)
Calcium: 8.5 mg/dL (ref 8.4–10.5)
Chloride: 101 meq/L (ref 96–112)
Creatinine, Ser: 0.93 mg/dL (ref 0.40–1.50)
GFR: 79.91 mL/min
Glucose, Bld: 80 mg/dL (ref 70–99)
Potassium: 4.1 meq/L (ref 3.5–5.1)
Sodium: 137 meq/L (ref 135–145)
Total Bilirubin: 1 mg/dL (ref 0.2–1.2)
Total Protein: 6.7 g/dL (ref 6.0–8.3)

## 2024-03-29 LAB — LIPID PANEL
Cholesterol: 179 mg/dL (ref 28–200)
HDL: 47.7 mg/dL
LDL Cholesterol: 110 mg/dL — ABNORMAL HIGH (ref 10–99)
NonHDL: 130.86
Total CHOL/HDL Ratio: 4
Triglycerides: 105 mg/dL (ref 10.0–149.0)
VLDL: 21 mg/dL (ref 0.0–40.0)

## 2024-03-29 LAB — TSH: TSH: 1.2 u[IU]/mL (ref 0.35–5.50)

## 2024-03-29 LAB — PSA: PSA: 0.34 ng/mL (ref 0.10–4.00)

## 2024-03-29 MED ORDER — ATORVASTATIN CALCIUM 40 MG PO TABS: 40.0000 mg | ORAL_TABLET | ORAL | 1 refills | Status: AC
